# Patient Record
Sex: Female | Born: 1990 | Hispanic: Yes | Marital: Single | Smoking: Former smoker
Health system: Southern US, Community
[De-identification: ages and names within clinical notes are randomized; demographics above are authoritative.]

## PROBLEM LIST (undated history)

## (undated) DIAGNOSIS — O98819 Other maternal infectious and parasitic diseases complicating pregnancy, unspecified trimester: Secondary | ICD-10-CM

## (undated) DIAGNOSIS — A749 Chlamydial infection, unspecified: Secondary | ICD-10-CM

## (undated) HISTORY — PX: NO PAST SURGERIES: SHX2092

## (undated) HISTORY — DX: Chlamydial infection, unspecified: A74.9

## (undated) HISTORY — DX: Other maternal infectious and parasitic diseases complicating pregnancy, unspecified trimester: O98.819

---
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
## 2017-04-16 NOTE — L&D Delivery Note (Signed)
Delivery Summary for Cassie Garza  Labor Events:   Preterm labor:   Rupture date: 01/07/2018  Rupture time: 10:17 AM  Rupture type: Spontaneous Intact  Fluid Color:   Induction:   Augmentation:   Complications:   Cervical ripening:          Delivery:   Episiotomy:   Lacerations:   Repair suture:   Repair # of packets:   Blood loss (ml): 470   Information for the patient's newborn:  Raylene MiyamotoBrambila Garza, Boy Cierra [[id number]][[id number]]    Delivery 01/07/2018 3:42 PM by  Vaginal, Spontaneous Sex:  female Gestational Age: [id number] Delivery Clinician:   Living?:         APGARS  One minute Five minutes Ten minutes  Skin color:        Heart rate:        Grimace:        Muscle tone:        Breathing:        Totals: 8  9      Presentation/position:      Resuscitation:   Cord information:    Disposition of cord blood:     Blood gases sent?  Complications:   Placenta: Delivered:       appearance Newborn Measurements: Weight: 7 lb 4.8 oz (3310 g)  Height: 20.28"  Head circumference:    Chest circumference:    Other providers:    Additional  information: Forceps:   Vacuum:   Breech:   Observed anomalies        Delivery Note At 3:42 PM a viable and healthy female was delivered via Vaginal, Spontaneous (Presentation: Vertex; LOA position).  APGAR: 8, 9; weight  3310 grams.   Placenta status: spontaneously removed, intact.  Cord: 3-vessel, with the following complications: none.  Cord pH: not obtained. Delayed cord clamping observed for 2 minutes.   Anesthesia:  IV analgesia Episiotomy: None Lacerations: None Suture Repair: None Est. Blood Loss (mL): 470  Mom to postpartum.  Baby to Couplet care / Skin to Skin.  Hildred Lasernika Aeralyn Barna 01/07/2018, 5:21 PM

## 2017-07-05 ENCOUNTER — Ambulatory Visit (INDEPENDENT_AMBULATORY_CARE_PROVIDER_SITE_OTHER): Payer: BLUE CROSS/BLUE SHIELD | Admitting: Advanced Practice Midwife

## 2017-07-05 ENCOUNTER — Encounter: Payer: Self-pay | Admitting: Advanced Practice Midwife

## 2017-07-05 VITALS — BP 112/80 | Ht 62.0 in | Wt 169.0 lb

## 2017-07-05 DIAGNOSIS — Z131 Encounter for screening for diabetes mellitus: Secondary | ICD-10-CM

## 2017-07-05 DIAGNOSIS — Z124 Encounter for screening for malignant neoplasm of cervix: Secondary | ICD-10-CM

## 2017-07-05 DIAGNOSIS — Z113 Encounter for screening for infections with a predominantly sexual mode of transmission: Secondary | ICD-10-CM

## 2017-07-05 DIAGNOSIS — O99210 Obesity complicating pregnancy, unspecified trimester: Secondary | ICD-10-CM

## 2017-07-05 DIAGNOSIS — O0990 Supervision of high risk pregnancy, unspecified, unspecified trimester: Secondary | ICD-10-CM

## 2017-07-05 DIAGNOSIS — Z6830 Body mass index (BMI) 30.0-30.9, adult: Secondary | ICD-10-CM

## 2017-07-05 NOTE — Patient Instructions (Signed)
Prenatal Care WHAT IS PRENATAL CARE? Prenatal care is the process of caring for a pregnant woman before she gives birth. Prenatal care makes sure that she and her baby remain as healthy as possible throughout pregnancy. Prenatal care may be provided by a midwife, family practice health care provider, or a childbirth and pregnancy specialist (obstetrician). Prenatal care may include physical examinations, testing, treatments, and education on nutrition, lifestyle, and social support services. WHY IS PRENATAL CARE SO IMPORTANT? Early and consistent prenatal care increases the chance that you and your baby will remain healthy throughout your pregnancy. This type of care also decreases a baby's risk of being born too early (prematurely), or being born smaller than expected (small for gestational age). Any underlying medical conditions you may have that could pose a risk during your pregnancy are discussed during prenatal care visits. You will also be monitored regularly for any new conditions that may arise during your pregnancy so they can be treated quickly and effectively. WHAT HAPPENS DURING PRENATAL CARE VISITS? Prenatal care visits may include the following: Discussion Tell your health care provider about any new signs or symptoms you have experienced since your last visit. These might include:  Nausea or vomiting.  Increased or decreased level of energy.  Difficulty sleeping.  Back or leg pain.  Weight changes.  Frequent urination.  Shortness of breath with physical activity.  Changes in your skin, such as the development of a rash or itchiness.  Vaginal discharge or bleeding.  Feelings of excitement or nervousness.  Changes in your baby's movements.  You may want to write down any questions or topics you want to discuss with your health care provider and bring them with you to your appointment. Examination During your first prenatal care visit, you will likely have a complete  physical exam. Your health care provider will often examine your vagina, cervix, and the position of your uterus, as well as check your heart, lungs, and other body systems. As your pregnancy progresses, your health care provider will measure the size of your uterus and your baby's position inside your uterus. He or she may also examine you for early signs of labor. Your prenatal visits may also include checking your blood pressure and, after about 10-12 weeks of pregnancy, listening to your baby's heartbeat. Testing Regular testing often includes:  Urinalysis. This checks your urine for glucose, protein, or signs of infection.  Blood count. This checks the levels of white and red blood cells in your body.  Tests for sexually transmitted infections (STIs). Testing for STIs at the beginning of pregnancy is routinely done and is required in many states.  Antibody testing. You will be checked to see if you are immune to certain illnesses, such as rubella, that can affect a developing fetus.  Glucose screen. Around 24-28 weeks of pregnancy, your blood glucose level will be checked for signs of gestational diabetes. Follow-up tests may be recommended.  Group B strep. This is a bacteria that is commonly found inside a woman's vagina. This test will inform your health care provider if you need an antibiotic to reduce the amount of this bacteria in your body prior to labor and childbirth.  Ultrasound. Many pregnant women undergo an ultrasound screening around 18-20 weeks of pregnancy to evaluate the health of the fetus and check for any developmental abnormalities.  HIV (human immunodeficiency virus) testing. Early in your pregnancy, you will be screened for HIV. If you are at high risk for HIV, this test may   be repeated during your third trimester of pregnancy.  You may be offered other testing based on your age, personal or family medical history, or other factors. HOW OFTEN SHOULD I PLAN TO SEE MY  HEALTH CARE PROVIDER FOR PRENATAL CARE? Your prenatal care check-up schedule depends on any medical conditions you have before, or develop during, your pregnancy. If you do not have any underlying medical conditions, you will likely be seen for checkups:  Monthly, during the first 6 months of pregnancy.  Twice a month during months 7 and 8 of pregnancy.  Weekly starting in the 9th month of pregnancy and until delivery.  If you develop signs of early labor or other concerning signs or symptoms, you may need to see your health care provider more often. Ask your health care provider what prenatal care schedule is best for you. WHAT CAN I DO TO KEEP MYSELF AND MY BABY AS HEALTHY AS POSSIBLE DURING MY PREGNANCY?  Take a prenatal vitamin containing 400 micrograms (0.4 mg) of folic acid every day. Your health care provider may also ask you to take additional vitamins such as iodine, vitamin D, iron, copper, and zinc.  Take 1500-2000 mg of calcium daily starting at your 20th week of pregnancy until you deliver your baby.  Make sure you are up to date on your vaccinations. Unless directed otherwise by your health care provider: ? You should receive a tetanus, diphtheria, and pertussis (Tdap) vaccination between the 27th and 36th week of your pregnancy, regardless of when your last Tdap immunization occurred. This helps protect your baby from whooping cough (pertussis) after he or she is born. ? You should receive an annual inactivated influenza vaccine (IIV) to help protect you and your baby from influenza. This can be done at any point during your pregnancy.  Eat a well-rounded diet that includes: ? Fresh fruits and vegetables. ? Lean proteins. ? Calcium-rich foods such as milk, yogurt, hard cheeses, and dark, leafy greens. ? Whole grain breads.  Do noteat seafood high in mercury, including: ? Swordfish. ? Tilefish. ? Shark. ? King mackerel. ? More than 6 oz tuna per week.  Do not  eat: ? Raw or undercooked meats or eggs. ? Unpasteurized foods, such as soft cheeses (brie, blue, or feta), juices, and milks. ? Lunch meats. ? Hot dogs that have not been heated until they are steaming.  Drink enough water to keep your urine clear or pale yellow. For many women, this may be 10 or more 8 oz glasses of water each day. Keeping yourself hydrated helps deliver nutrients to your baby and may prevent the start of pre-term uterine contractions.  Do not use any tobacco products including cigarettes, chewing tobacco, or electronic cigarettes. If you need help quitting, ask your health care provider.  Do not drink beverages containing alcohol. No safe level of alcohol consumption during pregnancy has been determined.  Do not use any illegal drugs. These can harm your developing baby or cause a miscarriage.  Ask your health care provider or pharmacist before taking any prescription or over-the-counter medicines, herbs, or supplements.  Limit your caffeine intake to no more than 200 mg per day.  Exercise. Unless told otherwise by your health care provider, try to get 30 minutes of moderate exercise most days of the week. Do not  do high-impact activities, contact sports, or activities with a high risk of falling, such as horseback riding or downhill skiing.  Get plenty of rest.  Avoid anything that raises your   body temperature, such as hot tubs and saunas.  If you own a cat, do not empty its litter box. Bacteria contained in cat feces can cause an infection called toxoplasmosis. This can result in serious harm to the fetus.  Stay away from chemicals such as insecticides, lead, mercury, and cleaning or paint products that contain solvents.  Do not have any X-rays taken unless medically necessary.  Take a childbirth and breastfeeding preparation class. Ask your health care provider if you need a referral or recommendation.  This information is not intended to replace advice given  to you by your health care provider. Make sure you discuss any questions you have with your health care provider. Document Released: 04/05/2003 Document Revised: 09/05/2015 Document Reviewed: 06/17/2013 Elsevier Interactive Patient Education  2017 [employer] Exercise During Pregnancy For people of all ages, exercise is an important part of being healthy. Exercise improves heart and lung function and helps to maintain strength, flexibility, and a healthy body weight. Exercise also boosts energy levels and elevates mood. For most women, maintaining an exercise routine throughout pregnancy is recommended. It is only on rare occasions and with certain medical conditions or pregnancy complications that women may be asked to limit or avoid exercise during pregnancy. What are some other benefits to exercising during pregnancy? Along with maintaining strength and flexibility, exercising throughout pregnancy can help to:  Keep strength in muscles that are very important during labor and childbirth.  Decrease low back pain during pregnancy.  Decrease the risk of developing gestational diabetes mellitus (GDM).  Improve blood sugar (glucose) control for women who have GDM.  Decrease the risk of developing preeclampsia. This is a serious condition that causes high blood pressure along with other symptoms, such as swelling and headaches.  Decrease the risk of cesarean delivery.  Speed up the recovery after giving birth.  How often should I exercise? Unless your health care provider gives you different instructions, you should try to exercise on most days or all days of the week. In general, try to exercise with moderate intensity for about 150 minutes per week. This can be spread out across several days, such as exercising for 30 minutes per day on 5 days of each week. You can tell that you are exercising at a moderate intensity if you have a higher heart rate and faster breathing, but you are still  able to hold a conversation. What types of moderate-intensity exercise are recommended during pregnancy? There are many types of exercise that are safe for you to do during pregnancy. Unless your health care provider gives you different instructions, do a variety of exercises that safely increase your heart and breathing (cardiopulmonary) rates and help you to build and maintain muscle strength (strength training). You should always be able to talk in full sentences while exercising during pregnancy. Some examples of exercising that is safe to do during pregnancy include:  Brisk walking or hiking.  Swimming.  Water aerobics.  Riding a stationary bike.  Strength training.  Modified yoga or Pilates. Tell your instructor that you are pregnant. Avoid overstretching and avoid lying on your back for long periods of time.  Running or jogging. Only choose this type of exercise if: ? You ran or jogged regularly before your pregnancy. ? You can run or jog and still talk in complete sentences.  What types of exercise should I not do during pregnancy? Depending on your level of fitness and whether you exercised regularly before your pregnancy, you may be   advised to limit vigorous-intensity exercise during your pregnancy. You can tell that you are exercising at a vigorous intensity if you are breathing much harder and faster and cannot hold a conversation while exercising. Some examples of exercising that you should avoid during pregnancy include:  Contact sports.  Activities that place you at risk for falling on or being hit in the belly, such as downhill skiing, water skiing, surfing, rock climbing, cycling, gymnastics, and horseback riding.  Scuba diving.  Sky diving.  Yoga or Pilates in a room that is heated to extreme temperatures ("hot yoga" or "hot Pilates").  Jogging or running, unless you ran or jogged regularly before your pregnancy. While jogging or running, you should always be able  to talk in full sentences. Do not run or jog so vigorously that you are unable to have a conversation.  If you are not used to exercising at elevation (more than 6,000 feet above sea level), do not do so during your pregnancy.  When should I avoid exercising during pregnancy? Certain medical conditions can make it unsafe to exercise during pregnancy, or they may increase your risk of miscarriage or early labor and birth. Some of these conditions include:  Some types of heart disease.  Some types of lung disease.  Placenta previa. This is when the placenta partially or completely covers the opening of the uterus (cervix).  Frequent bleeding from the vagina during your pregnancy.  Incompetent cervix. This is when your cervix does not remain as tightly closed during pregnancy as it should.  Premature labor.  Ruptured membranes. This is when the protective sac (amniotic sac) opens up and amniotic fluid leaks from your vagina.  Severely low blood count (anemia).  Preeclampsia or pregnancy-caused high blood pressure.  Carrying more than one baby (multiple gestation) and having an additional risk of early labor.  Poorly controlled diabetes.  Being severely underweight or severely overweight.  Intrauterine growth restriction. This is when your baby's growth and development during pregnancy are slower than expected.  Other medical conditions. Ask your health care provider if any apply to you.  What else should I know about exercising during pregnancy? You should take these precautions while exercising during pregnancy:  Avoid overheating. ? Wear loose-fitting, breathable clothes. ? Do not exercise in very high temperatures.  Avoid dehydration. Drink enough water before, during, and after exercise to keep your urine clear or pale yellow.  Avoid overstretching. Because of hormone changes during pregnancy, it is easy to overstretch muscles, tendons, and ligaments during  pregnancy.  Start slowly and ask your health care provider to recommend types of exercise that are safe for you, if exercising regularly is new for you.  Pregnancy is not a time for exercising to lose weight. When should I seek medical care? You should stop exercising and call your health care provider if you have any unusual symptoms, such as:  Mild uterine contractions or abdominal cramping.  Dizziness that does not improve with rest.  When should I seek immediate medical care? You should stop exercising and call your local emergency services (911 in the [place] if you have any unusual symptoms, such as:  Sudden, severe pain in your low back or your belly.  Uterine contractions or abdominal cramping that do not improve with rest.  Chest pain.  Bleeding or fluid leaking from your vagina.  Shortness of breath.  This information is not intended to replace advice given to you by your health care provider. Make sure you discuss any   questions you have with your health care provider. Document Released: 04/02/2005 Document Revised: 08/31/2015 Document Reviewed: 06/10/2014 Elsevier Interactive Patient Education  2018 [employer] Eating Plan for Pregnant Women While you are pregnant, your body will require additional nutrition to help support your growing baby. It is recommended that you consume:  150 additional calories each day during your first trimester.  300 additional calories each day during your second trimester.  300 additional calories each day during your third trimester.  Eating a healthy, well-balanced diet is very important for your health and for your baby's health. You also have a higher need for some vitamins and minerals, such as folic acid, calcium, iron, and vitamin D. What do I need to know about eating during pregnancy?  Do not try to lose weight or go on a diet during pregnancy.  Choose healthy, nutritious foods. Choose  of a sandwich with a glass of milk  instead of a candy bar or a high-calorie sugar-sweetened beverage.  Limit your overall intake of foods that have "empty calories." These are foods that have little nutritional value, such as sweets, desserts, candies, sugar-sweetened beverages, and fried foods.  Eat a variety of foods, especially fruits and vegetables.  Take a prenatal vitamin to help meet the additional needs during pregnancy, specifically for folic acid, iron, calcium, and vitamin D.  Remember to stay active. Ask your health care provider for exercise recommendations that are specific to you.  Practice good food safety and cleanliness, such as washing your hands before you eat and after you prepare raw meat. This helps to prevent foodborne illnesses, such as listeriosis, that can be very dangerous for your baby. Ask your health care provider for more information about listeriosis. What does 150 extra calories look like? Healthy options for an additional 150 calories each day could be any of the following:  Plain low-fat yogurt (6-8 oz) with  cup of berries.  1 apple with 2 teaspoons of peanut butter.  Cut-up vegetables with  cup of hummus.  Low-fat chocolate milk (8 oz or 1 cup).  1 string cheese with 1 medium orange.   of a peanut butter and jelly sandwich on whole-wheat bread (1 tsp of peanut butter).  For 300 calories, you could eat two of those healthy options each day. What is a healthy amount of weight to gain? The recommended amount of weight for you to gain is based on your pre-pregnancy BMI. If your pre-pregnancy BMI was:  Less than 18 (underweight), you should gain 28-40 lb.  18-24.9 (normal), you should gain 25-35 lb.  25-29.9 (overweight), you should gain 15-25 lb.  Greater than 30 (obese), you should gain 11-20 lb.  What if I am having twins or multiples? Generally, pregnant women who will be having twins or multiples may need to increase their daily calories by 300-600 calories each day. The  recommended range for total weight gain is 25-54 lb, depending on your pre-pregnancy BMI. Talk with your health care provider for specific guidance about additional nutritional needs, weight gain, and exercise during your pregnancy. What foods can I eat? Grains Any grains. Try to choose whole grains, such as whole-wheat bread, oatmeal, or brown rice. Vegetables Any vegetables. Try to eat a variety of colors and types of vegetables to get a full range of vitamins and minerals. Remember to wash your vegetables well before eating. Fruits Any fruits. Try to eat a variety of colors and types of fruit to get a full range of vitamins and   minerals. Remember to wash your fruits well before eating. Meats and Other Protein Sources Lean meats, including chicken, turkey, fish, and lean cuts of beef, veal, or pork. Make sure that all meats are cooked to "well done." Tofu. Tempeh. Beans. Eggs. Peanut butter and other nut butters. Seafood, such as shrimp, crab, and lobster. If you choose fish, select types that are higher in omega-3 fatty acids, including salmon, herring, mussels, trout, sardines, and pollock. Make sure that all meats are cooked to food-safe temperatures. Dairy Pasteurized milk and milk alternatives. Pasteurized yogurt and pasteurized cheese. Cottage cheese. Sour cream. Beverages Water. Juices that contain 100% fruit juice or vegetable juice. Caffeine-free teas and decaffeinated coffee. Drinks that contain caffeine are okay to drink, but it is better to avoid caffeine. Keep your total caffeine intake to less than 200 mg each day (12 oz of coffee, tea, or soda) or as directed by your health care provider. Condiments Any pasteurized condiments. Sweets and Desserts Any sweets and desserts. Fats and Oils Any fats and oils. The items listed above may not be a complete list of recommended foods or beverages. Contact your dietitian for more options. What foods are not  recommended? Vegetables Unpasteurized (raw) vegetable juices. Fruits Unpasteurized (raw) fruit juices. Meats and Other Protein Sources Cured meats that have nitrates, such as bacon, salami, and hotdogs. Luncheon meats, bologna, or other deli meats (unless they are reheated until they are steaming hot). Refrigerated pate, meat spreads from a meat counter, smoked seafood that is found in the refrigerated section of a store. Raw fish, such as sushi or sashimi. High mercury content fish, such as tilefish, shark, swordfish, and king mackerel. Raw meats, such as tuna or beef tartare. Undercooked meats and poultry. Make sure that all meats are cooked to food-safe temperatures. Dairy Unpasteurized (raw) milk and any foods that have raw milk in them. Soft cheeses, such as feta, queso blanco, queso fresco, Brie, Camembert cheeses, blue-veined cheeses, and Panela cheese (unless it is made with pasteurized milk, which must be stated on the label). Beverages Alcohol. Sugar-sweetened beverages, such as sodas, teas, or energy drinks. Condiments Homemade fermented foods and drinks, such as pickles, sauerkraut, or kombucha drinks. (Store-bought pasteurized versions of these are okay.) Other Salads that are made in the store, such as ham salad, chicken salad, egg salad, tuna salad, and seafood salad. The items listed above may not be a complete list of foods and beverages to avoid. Contact your dietitian for more information. This information is not intended to replace advice given to you by your health care provider. Make sure you discuss any questions you have with your health care provider. Document Released: 01/15/2014 Document Revised: 09/08/2015 Document Reviewed: 09/15/2013 Elsevier Interactive Patient Education  2018 [employer]  

## 2017-07-05 NOTE — Progress Notes (Signed)
C/o little 'pinchy cramps' on both sides of abdomen.rj

## 2017-07-08 DIAGNOSIS — O0990 Supervision of high risk pregnancy, unspecified, unspecified trimester: Secondary | ICD-10-CM | POA: Insufficient documentation

## 2017-07-08 NOTE — Progress Notes (Signed)
New Obstetric Patient H&P    Chief Complaint: "Desires prenatal care"   History of Present Illness: Patient is a 27 y.o. G1P0 Hispanic or Latino female, presents with amenorrhea and positive home pregnancy test. Patient's last menstrual period was 04/08/2017. and based on her  LMP, her EDD is Estimated Date of Delivery: 01/13/18 and her EGA is 9468w4d. Cycles are 5. days, irregular, and occur approximately every : 30-45 days. She has never had a PAP smear.   She had a urine pregnancy test which was positive 3 week(s)  ago. Her last menstrual period was normal and lasted for  5 day(s). Since her LMP she claims she has experienced breast tenderness, fatigue, nausea, vomiting. She denies vaginal bleeding. Her past medical history is noncontributory. This is her first pregnancy.  Since her LMP, she admits to the use of tobacco products  no She claims she has gained   2 pounds since the start of her pregnancy.  There are cats in the home in the home  no  She admits close contact with children on a regular basis  no  She has had chicken pox in the past yes She has had Tuberculosis exposures, symptoms, or previously tested positive for TB   no Current or past history of domestic violence. no  Genetic Screening/Teratology Counseling: (Includes patient, baby's father, or anyone in either family with:)   1. [facility]= [facility]  no 2. Thalassemia ([place], [place], Mediterranean, or Asian background): MCV<80  no 3. Neural tube defect (meningomyelocele, spina bifida, anencephaly)  no 4. Congenital heart defect  no  5. Down syndrome  no 6. Tay-Sachs (Jewish, [place])  no 7. Canavan's Disease  no 8. Sickle cell disease or trait (African)  no  9. Hemophilia or other blood disorders  no  10. Muscular dystrophy  no  11. Cystic fibrosis  no  12. Huntington's Chorea  no  13. Mental retardation/autism  no 14. Other inherited genetic or chromosomal disorder  no 15. Maternal metabolic  disorder (DM, PKU, etc)  no 16. Patient or FOB with a child with a birth defect not listed above no  16a. Patient or FOB with a birth defect themselves no 17. Recurrent pregnancy loss, or stillbirth  no  18. Any medications since LMP other than prenatal vitamins (include vitamins, supplements, OTC meds, drugs, alcohol)  no 19. Any other genetic/environmental exposure to discuss  no  Infection History:   1. Lives with someone with TB or TB exposed  no  2. Patient or partner has history of genital herpes  no 3. Rash or viral illness since LMP  no 4. History of STI (GC, CT, HPV, syphilis, HIV)  no 5. History of recent travel :  no  Other pertinent information:  no     Review of Systems:10 point review of systems negative unless otherwise noted in HPI  Past Medical History:  History reviewed. No pertinent past medical history.  Past Surgical History:  Past Surgical History:  Procedure Laterality Date  . NO PAST SURGERIES      Gynecologic History: Patient's last menstrual period was 04/08/2017.  Obstetric History: G1P0  Family History:  Family History  Problem Relation Age of Onset  . Cancer Brother 7       LEUKEMIA  . Diabetes Maternal Grandmother   . Hypertension Maternal Grandmother     Social History:  Social History   Socioeconomic History  . Marital status: Single    Spouse name: Not  on file  . Number of children: 0  . Years of education: 82  . Highest education level: Not on file  Occupational History  . Occupation: [employer]    Comment: [employer]  Social Needs  . Financial resource strain: Not on file  . Food insecurity:    Worry: Not on file    Inability: Not on file  . Transportation needs:    Medical: Not on file    Non-medical: Not on file  Tobacco Use  . Smoking status: Never Smoker  . Smokeless tobacco: Never Used  Substance and Sexual Activity  . Alcohol use: Not Currently  . Drug use: Never  . Sexual activity: Yes    Birth  control/protection: None  Lifestyle  . Physical activity:    Days per week: Not on file    Minutes per session: Not on file  . Stress: Not on file  Relationships  . Social connections:    Talks on phone: Not on file    Gets together: Not on file    Attends religious service: Not on file    Active member of club or organization: Not on file    Attends meetings of clubs or organizations: Not on file    Relationship status: Not on file  . Intimate partner violence:    Fear of current or ex partner: Not on file    Emotionally abused: Not on file    Physically abused: Not on file    Forced sexual activity: Not on file  Other Topics Concern  . Not on file  Social History Narrative  . Not on file    Allergies:  No Known Allergies  Medications: Prior to Admission medications   Not on File    Physical Exam Vitals: Blood pressure 112/80, height 5\' 2"  (1.575 m), weight 169 lb (76.7 kg), last menstrual period 04/08/2017.  General: NAD HEENT: normocephalic, anicteric Thyroid: no enlargement, no palpable nodules Pulmonary: No increased work of breathing, CTAB Cardiovascular: RRR, distal pulses 2+ Abdomen: NABS, soft, non-tender, non-distended.  Umbilicus without lesions.  No hepatomegaly, splenomegaly or masses palpable. No evidence of hernia  Genitourinary:  External: Normal external female genitalia.  Normal urethral meatus, normal  Bartholin's and Skene's glands.    Vagina: Normal vaginal mucosa, no evidence of prolapse.    Cervix: Grossly normal in appearance, no bleeding, no CMT  Uterus: Enlarged, mobile, normal contour.    Adnexa: ovaries non-enlarged, no adnexal masses  Rectal: deferred Extremities: no edema, erythema, or tenderness Neurologic: Grossly intact Psychiatric: mood appropriate, affect full   Assessment: 27 y.o. G1P0 at [redacted]w[redacted]d presenting to initiate prenatal care  Plan: 1) Avoid alcoholic beverages. 2) Patient encouraged not to smoke.  3) Discontinue the  use of all non-medicinal drugs and chemicals.  4) Take prenatal vitamins daily.  5) Nutrition, food safety (fish, cheese advisories, and high nitrite foods) and exercise discussed. 6) Hospital and practice style discussed with cross coverage system.  7) Genetic Screening, such as with 1st Trimester Screening, cell free fetal DNA, AFP testing, and Ultrasound, as well as with amniocentesis and CVS as appropriate, is discussed with patient. At the conclusion of today's visit patient declined genetic testing 8) Patient is asked about travel to areas at risk for the [place] virus, and counseled to avoid travel and exposure to mosquitoes or sexual partners who may have themselves been exposed to the virus. Testing is discussed, and will be ordered as appropriate.  9) Dating scan in 1 week  Tresea Mall, CNM [facility], [facility] 07/08/2017, 1:25 PM

## 2017-07-10 LAB — URINE CULTURE

## 2017-07-10 LAB — IGP,CTNGTV,RFX APTIMA HPV ASCU
CHLAMYDIA, NUC. ACID AMP: POSITIVE — AB
GONOCOCCUS, NUC. ACID AMP: NEGATIVE
PAP Smear Comment: 0
TRICH VAG BY NAA: NEGATIVE

## 2017-07-15 ENCOUNTER — Ambulatory Visit (INDEPENDENT_AMBULATORY_CARE_PROVIDER_SITE_OTHER): Payer: BLUE CROSS/BLUE SHIELD

## 2017-07-15 ENCOUNTER — Other Ambulatory Visit: Payer: BLUE CROSS/BLUE SHIELD

## 2017-07-15 ENCOUNTER — Other Ambulatory Visit: Payer: Self-pay | Admitting: Advanced Practice Midwife

## 2017-07-15 ENCOUNTER — Ambulatory Visit (INDEPENDENT_AMBULATORY_CARE_PROVIDER_SITE_OTHER): Payer: BLUE CROSS/BLUE SHIELD | Admitting: Obstetrics and Gynecology

## 2017-07-15 ENCOUNTER — Encounter: Payer: Self-pay | Admitting: Obstetrics and Gynecology

## 2017-07-15 VITALS — BP 118/74 | Wt 170.0 lb

## 2017-07-15 DIAGNOSIS — Z6830 Body mass index (BMI) 30.0-30.9, adult: Secondary | ICD-10-CM

## 2017-07-15 DIAGNOSIS — O0990 Supervision of high risk pregnancy, unspecified, unspecified trimester: Secondary | ICD-10-CM

## 2017-07-15 DIAGNOSIS — O99210 Obesity complicating pregnancy, unspecified trimester: Secondary | ICD-10-CM

## 2017-07-15 DIAGNOSIS — Z113 Encounter for screening for infections with a predominantly sexual mode of transmission: Secondary | ICD-10-CM

## 2017-07-15 DIAGNOSIS — A749 Chlamydial infection, unspecified: Secondary | ICD-10-CM

## 2017-07-15 DIAGNOSIS — Z3A14 14 weeks gestation of pregnancy: Secondary | ICD-10-CM

## 2017-07-15 DIAGNOSIS — Z3689 Encounter for other specified antenatal screening: Secondary | ICD-10-CM

## 2017-07-15 DIAGNOSIS — O98812 Other maternal infectious and parasitic diseases complicating pregnancy, second trimester: Secondary | ICD-10-CM

## 2017-07-15 DIAGNOSIS — O2340 Unspecified infection of urinary tract in pregnancy, unspecified trimester: Secondary | ICD-10-CM | POA: Insufficient documentation

## 2017-07-15 DIAGNOSIS — O2342 Unspecified infection of urinary tract in pregnancy, second trimester: Secondary | ICD-10-CM

## 2017-07-15 DIAGNOSIS — O98819 Other maternal infectious and parasitic diseases complicating pregnancy, unspecified trimester: Secondary | ICD-10-CM

## 2017-07-15 DIAGNOSIS — Z131 Encounter for screening for diabetes mellitus: Secondary | ICD-10-CM

## 2017-07-15 HISTORY — DX: Other maternal infectious and parasitic diseases complicating pregnancy, unspecified trimester: A74.9

## 2017-07-15 LAB — OB RESULTS CONSOLE VARICELLA ZOSTER ANTIBODY, IGG: Varicella: IMMUNE

## 2017-07-15 MED ORDER — CEPHALEXIN 500 MG PO CAPS: 500.0000 mg | ORAL_CAPSULE | Freq: Four times a day (QID) | ORAL | 2 refills | Status: DC

## 2017-07-15 MED ORDER — AZITHROMYCIN 500 MG PO TABS: 1000.0000 mg | ORAL_TABLET | Freq: Once | ORAL | 0 refills | Status: AC

## 2017-07-15 NOTE — Progress Notes (Signed)
  Routine Prenatal Care Visit  Subjective  Cassie MoloneyFabiola Betsey HolidayBrambila Morales is a 27 y.o. G1P0 at 7319w0d being seen today for ongoing prenatal care.  She is currently monitored for the following issues for this high-risk pregnancy and has Supervision of high risk pregnancy, antepartum; Obesity in pregnancy; BMI 30.0-30.9,adult; Chlamydia infection affecting pregnancy; and UTI (urinary tract infection) during pregnancy on their problem list.  ----------------------------------------------------------------------------------- Patient reports no complaints.    . Vag. Bleeding: None.   . Denies leaking of fluid.  ----------------------------------------------------------------------------------- The following portions of the patient's history were reviewed and updated as appropriate: allergies, current medications, past family history, past medical history, past social history, past surgical history and problem list. Problem list updated.   Objective  Blood pressure 118/74, weight 170 lb (77.1 kg), last menstrual period 04/08/2017. Pregravid weight 167 lb (75.8 kg) Total Weight Gain 3 lb (1.361 kg) Urinalysis: Urine Protein: Negative Urine Glucose: Negative  Fetal Status: Fetal Heart Rate (bpm): Present         General:  Alert, oriented and cooperative. Patient is in no acute distress.  Skin: Skin is warm and dry. No rash noted.   Cardiovascular: Normal heart rate noted  Respiratory: Normal respiratory effort, no problems with respiration noted  Abdomen: Soft, gravid, appropriate for gestational age. Pain/Pressure: Absent     Pelvic:  Cervical exam deferred        Extremities: Normal range of motion.     Mental Status: Normal mood and affect. Normal behavior. Normal judgment and thought content.   Assessment   27 y.o. G1P0 at 6919w0d by  01/13/2018, by Last Menstrual Period presenting for routine prenatal visit  Plan   FIRST Problems (from 07/05/17 to present)    Problem Noted Resolved   Obesity  in pregnancy 07/15/2017 by Conard NovakJackson, Nolberto Cheuvront D, MD No   Overview Signed 07/15/2017  3:39 PM by Conard NovakJackson, Alhaji Mcneal D, MD    [ ]  early 1h gtt      BMI 30.0-30.9,adult 07/15/2017 by Conard NovakJackson, Caressa Scearce D, MD No   Chlamydia infection affecting pregnancy 07/15/2017 by Conard NovakJackson, Herley Bernardini D, MD No   Overview Signed 07/15/2017  3:52 PM by Conard NovakJackson, Loma Dubuque D, MD    - Treated 4/1 [ ]  needs TOC early May      UTI (urinary tract infection) during pregnancy 07/15/2017 by Conard NovakJackson, Emely Fahy D, MD No       Preterm labor symptoms and general obstetric precautions including but not limited to vaginal bleeding, contractions, leaking of fluid and fetal movement were reviewed in detail with the patient. Please refer to After Visit Summary for other counseling recommendations.  - Discussed chlamydia infection. Will provide rx. Discussed that partner needs to be treated. She is confident her partner will be treated. Discussed abstaining until treatment complete.  TOC one month.  - Treat UTI with keflex (intermediate to macrobid) -anatomy u/s next visit -NOB labs and 1h gtt today.  Return in about 1 month (around 08/12/2017) for u/s for anatomy and routine prenatal.  Thomasene MohairStephen Journey Ratterman, MD, Merlinda FrederickFACOG [facility], [facility] 07/15/2017 3:54 PM

## 2017-07-15 NOTE — Progress Notes (Incomplete)
  Routine Prenatal Care Visit  Subjective  Cassie Garza is a 27 y.o. G1P0 at 5579w0d being seen today for ongoing prenatal care.  She is currently monitored for the following issues for this {Blank single:19197::"high-risk","low-risk"} pregnancy and has Supervision of high risk pregnancy, antepartum; Obesity in pregnancy; BMI 30.0-30.9,adult; Chlamydia infection affecting pregnancy; and UTI (urinary tract infection) during pregnancy on their problem list.  ----------------------------------------------------------------------------------- Patient reports {sx:14538}.    . Vag. Bleeding: None.   . Denies leaking of fluid.  ----------------------------------------------------------------------------------- The following portions of the patient's history were reviewed and updated as appropriate: allergies, current medications, past family history, past medical history, past social history, past surgical history and problem list. Problem list updated.   Objective  Blood pressure 118/74, weight 170 lb (77.1 kg), last menstrual period 04/08/2017. Pregravid weight 167 lb (75.8 kg) Total Weight Gain 3 lb (1.361 kg) Urinalysis: Urine Protein: Negative Urine Glucose: Negative  Fetal Status: Fetal Heart Rate (bpm): Present         General:  Alert, oriented and cooperative. Patient is in no acute distress.  Skin: Skin is warm and dry. No rash noted.   Cardiovascular: Normal heart rate noted  Respiratory: Normal respiratory effort, no problems with respiration noted  Abdomen: Soft, gravid, appropriate for gestational age. Pain/Pressure: Absent     Pelvic:  {Blank single:19197::"Cervical exam performed","Cervical exam deferred"}        Extremities: Normal range of motion.     Mental Status: Normal mood and affect. Normal behavior. Normal judgment and thought content.   Assessment   27 y.o. G1P0 at 2179w0d by  01/13/2018, by Last Menstrual Period presenting for {Blank  single:19197::"routine","work-in"} prenatal visit  Plan   FIRST Problems (from 07/05/17 to present)    Problem Noted Resolved   Obesity in pregnancy 07/15/2017 by Conard NovakJackson, Halen Mossbarger D, MD No   Overview Signed 07/15/2017  3:39 PM by Conard NovakJackson, Shaquila Sigman D, MD    [ ]  early 1h gtt      BMI 30.0-30.9,adult 07/15/2017 by Conard NovakJackson, Hydeia Mcatee D, MD No   Chlamydia infection affecting pregnancy 07/15/2017 by Conard NovakJackson, Reeya Bound D, MD No   Overview Signed 07/15/2017  3:52 PM by Conard NovakJackson, Spenser Harren D, MD    - Treated 4/1 [ ]  needs TOC early May      UTI (urinary tract infection) during pregnancy 07/15/2017 by Conard NovakJackson, Kynslei Art D, MD No       {Blank single:19197::"Term","Preterm"} labor symptoms and general obstetric precautions including but not limited to vaginal bleeding, contractions, leaking of fluid and fetal movement were reviewed in detail with the patient. Please refer to After Visit Summary for other counseling recommendations.   Return in about 1 month (around 08/12/2017) for u/s for anatomy and routine prenatal.  Thomasene MohairStephen Elvina Bosch, MD, Merlinda FrederickFACOG [facility], [facility] 07/15/2017 3:54 PM

## 2017-07-16 LAB — RPR+RH+ABO+RUB AB+AB SCR+CB...
ANTIBODY SCREEN: NEGATIVE
HEMATOCRIT: 34.1 % (ref 34.0–46.6)
HEMOGLOBIN: 11.7 g/dL (ref 11.1–15.9)
HIV Screen 4th Generation wRfx: NONREACTIVE
Hepatitis B Surface Ag: NEGATIVE
MCH: 29.8 pg (ref 26.6–33.0)
MCHC: 34.3 g/dL (ref 31.5–35.7)
MCV: 87 fL (ref 79–97)
Platelets: 223 10*3/uL (ref 150–379)
RBC: 3.93 x10E6/uL (ref 3.77–5.28)
RDW: 14.6 % (ref 12.3–15.4)
RH TYPE: POSITIVE
RPR Ser Ql: NONREACTIVE
RUBELLA: 5.77 {index} (ref >0.99)
Varicella zoster IgG: >4000 index (ref >165)
WBC: 6.4 10*3/uL (ref 3.4–10.8)

## 2017-07-16 LAB — HEMOGLOBINOPATHY EVALUATION
HEMOGLOBIN F QUANTITATION: 0 % (ref 0.0–2.0)
HGB A: 97.6 % (ref 96.4–98.8)
HGB C: 0 %
HGB S: 0 %
HGB VARIANT: 0 %
Hemoglobin A2 Quantitation: 2.4 % (ref 1.8–3.2)

## 2017-07-16 LAB — GLUCOSE, 1 HOUR GESTATIONAL: Gestational Diabetes Screen: 120 mg/dL (ref 65–139)

## 2017-07-25 ENCOUNTER — Telehealth: Payer: Self-pay

## 2017-07-25 NOTE — Telephone Encounter (Signed)
FMLA/DISABILITY form for [employer] filled out, signature obtained, and given to [place] for processing.

## 2017-07-27 ENCOUNTER — Emergency Department: Payer: BLUE CROSS/BLUE SHIELD

## 2017-07-27 ENCOUNTER — Encounter: Payer: Self-pay | Admitting: Medical Oncology

## 2017-07-27 ENCOUNTER — Emergency Department
Admission: EM | Admit: 2017-07-27 | Discharge: 2017-07-27 | Disposition: A | Discharge status: Home / Self Care | Payer: BLUE CROSS/BLUE SHIELD | Attending: Emergency Medicine | Admitting: Emergency Medicine

## 2017-07-27 DIAGNOSIS — O200 Threatened abortion: Secondary | ICD-10-CM | POA: Insufficient documentation

## 2017-07-27 DIAGNOSIS — O4690 Antepartum hemorrhage, unspecified, unspecified trimester: Secondary | ICD-10-CM

## 2017-07-27 DIAGNOSIS — O26852 Spotting complicating pregnancy, second trimester: Secondary | ICD-10-CM | POA: Insufficient documentation

## 2017-07-27 DIAGNOSIS — Z3A15 15 weeks gestation of pregnancy: Secondary | ICD-10-CM | POA: Insufficient documentation

## 2017-07-27 LAB — BASIC METABOLIC PANEL
ANION GAP: 5 (ref 5–15)
BUN: 7 mg/dL (ref 6–20)
CALCIUM: 9.3 mg/dL (ref 8.9–10.3)
CHLORIDE: 104 mmol/L (ref 101–111)
CO2: 23 mmol/L (ref 22–32)
Creatinine, Ser: 0.46 mg/dL (ref 0.44–1.00)
GFR calc non Af Amer: >60 mL/min (ref >60)
Glucose, Bld: 100 mg/dL — ABNORMAL HIGH (ref 65–99)
Potassium: 3.7 mmol/L (ref 3.5–5.1)
SODIUM: 132 mmol/L — AB (ref 135–145)

## 2017-07-27 LAB — HCG, QUANTITATIVE, PREGNANCY: hCG, Beta Chain, Quant, S: 49145 m[IU]/mL — ABNORMAL HIGH (ref <5)

## 2017-07-27 LAB — CBC WITH DIFFERENTIAL/PLATELET
BASOS ABS: 0 10*3/uL (ref 0–0.1)
BASOS PCT: 1 %
Eosinophils Absolute: 0.2 10*3/uL (ref 0–0.7)
Eosinophils Relative: 2 %
HEMATOCRIT: 35.6 % (ref 35.0–47.0)
HEMOGLOBIN: 12.6 g/dL (ref 12.0–16.0)
Lymphocytes Relative: 30 %
Lymphs Abs: 2.6 10*3/uL (ref 1.0–3.6)
MCH: 30.1 pg (ref 26.0–34.0)
MCHC: 35.3 g/dL (ref 32.0–36.0)
MCV: 85.5 fL (ref 80.0–100.0)
Monocytes Absolute: 0.7 10*3/uL (ref 0.2–0.9)
Monocytes Relative: 8 %
NEUTROS ABS: 5.1 10*3/uL (ref 1.4–6.5)
Neutrophils Relative %: 59 %
Platelets: 259 10*3/uL (ref 150–440)
RBC: 4.16 MIL/uL (ref 3.80–5.20)
RDW: 14.8 % — ABNORMAL HIGH (ref 11.5–14.5)
WBC: 8.6 10*3/uL (ref 3.6–11.0)

## 2017-07-27 NOTE — ED Triage Notes (Addendum)
Pt reports she is [redacted] weeks pregnant, yesterday she began having some vaginal spotting, bleeding increased last night. Pt denies pain. G1P0. EDD 01/13/18

## 2017-07-27 NOTE — Discharge Instructions (Signed)
Fortunately today your blood work and your ultrasound were reassuring.  Please follow-up with your OB gynecologist this coming Monday for reevaluation and return to the emergency department sooner for any concerns such as worsening bleeding, painful bleeding, or for any other issues whatsoever.  It was a pleasure to take care of you today, and thank you for coming to our emergency department.  If you have any questions or concerns before leaving please ask the nurse to grab me and I'm more than happy to go through your aftercare instructions again.  If you were prescribed any opioid pain medication today such as Norco, Vicodin, Percocet, morphine, hydrocodone, or oxycodone please make sure you do not drive when you are taking this medication as it can alter your ability to drive safely.  If you have any concerns once you are home that you are not improving or are in fact getting worse before you can make it to your follow-up appointment, please do not hesitate to call 911 and come back for further evaluation.  Merrily Brittle, MD  Results for orders placed or performed during the hospital encounter of 07/27/17  hCG, quantitative, pregnancy  Result Value Ref Range   hCG, Beta Chain, Quant, S 49,145 (H) <5 mIU/mL  CBC with Differential  Result Value Ref Range   WBC 8.6 3.6 - 11.0 K/uL   RBC 4.16 3.80 - 5.20 MIL/uL   Hemoglobin 12.6 12.0 - 16.0 g/dL   HCT 16.1 09.6 - 04.5 %   MCV 85.5 80.0 - 100.0 fL   MCH 30.1 26.0 - 34.0 pg   MCHC 35.3 32.0 - 36.0 g/dL   RDW 40.9 (H) 81.1 - 91.4 %   Platelets 259 150 - 440 K/uL   Neutrophils Relative % 59 %   Neutro Abs 5.1 1.4 - 6.5 K/uL   Lymphocytes Relative 30 %   Lymphs Abs 2.6 1.0 - 3.6 K/uL   Monocytes Relative 8 %   Monocytes Absolute 0.7 0.2 - 0.9 K/uL   Eosinophils Relative 2 %   Eosinophils Absolute 0.2 0 - 0.7 K/uL   Basophils Relative 1 %   Basophils Absolute 0.0 0 - 0.1 K/uL  Basic metabolic panel  Result Value Ref Range   Sodium 132  (L) 135 - 145 mmol/L   Potassium 3.7 3.5 - 5.1 mmol/L   Chloride 104 101 - 111 mmol/L   CO2 23 22 - 32 mmol/L   Glucose, Bld 100 (H) 65 - 99 mg/dL   BUN 7 6 - 20 mg/dL   Creatinine, Ser 7.82 0.44 - 1.00 mg/dL   Calcium 9.3 8.9 - 95.6 mg/dL   GFR calc non Af Amer >60 >60 mL/min   GFR calc Af Amer >60 >60 mL/min   Anion gap 5 5 - 15   US Ob Limited > 14 Wks  Result Date: 07/27/2017 CLINICAL DATA:  Fifteen weeks pregnant, vaginal spotting last night EXAM: LIMITED OBSTETRIC ULTRASOUND FINDINGS: Number of Fetuses: 1 Heart Rate:  140 bpm Movement: Yes Presentation: Cephalic Placental Location: Fundal posterior Previa: No Amniotic Fluid (Subjective):  Within normal limits. BPD: 3.2 cm 16 w  1 d Femur length: 1.8 cm 15 w 2 d MATERNAL FINDINGS: Cervix:  Appears closed.  Measures 3.6 cm in length. Uterus/Adnexae: No abnormality visualized. IMPRESSION: Single live intrauterine pregnancy with estimated gestational age of [redacted] weeks and 1 day by BPD measurement. This exam is performed on an emergent basis and does not comprehensively evaluate fetal size, dating, or anatomy; follow-up complete  OB US should be considered if further fetal assessment is warranted. Electronically Signed   By: Bary RichardStan  Maynard M.D.   On: 07/27/2017 11:41   [place] Ob Follow Up  Result Date: 07/15/2017 ULTRASOUND REPORT Location: [facility] Date of Service: 07/15/2017 Indications:dating >/= 14 weeks Findings: Mason JimSingleton intrauterine pregnancy is visualized with a CRL consistent with 4246w4d gestation and a BPD consistent with 2648w1d, which gives a gestational age of 1831w6d, giving an (U/S) EDD of 01/14/2018 The (U/S) EDD is consistent with the clinically established EDD based on LMP of 01/13/2018. FHR: 167 bpm CRL measurement: 73.9 mm BPD measurement: 24.253mm Yolk sac is not visualized and early anatomy is normal. Amnion: visualized and appears normal Right Ovary is not identified. Left Ovary is normal appearance. Corpus luteal cyst:  is not  visualized Survey of the adnexa demonstrates no adnexal masses. There is no free peritoneal fluid in the cul de sac. Impression: 1. 331w6d Viable Singleton Intrauterine pregnancy by U/S. 2. (U/S) EDD is consistent with Clinically established EDD of 01/13/2018. Recommendations: 1.Clinical correlation with the patient's History and Physical Exam. Willette AlmaKristen Priestley, RDMS, RVT There is a viable singleton gestation.  Detailed evaluation of the fetal anatomy is precluded by early gestational age.  It must be noted that a normal ultrasound particular at this early gestational age is unable to rule out fetal aneuploidy, risk of first trimester miscarriage, or anatomic birth defects. Thomasene MohairStephen Jackson, MD, Merlinda FrederickFACOG [facility], [facility] 07/15/2017 3:35 PM

## 2017-07-27 NOTE — ED Notes (Signed)
Patient transported to Ultrasound 

## 2017-07-27 NOTE — ED Provider Notes (Signed)
[facility] Emergency Department Provider Note  ____________________________________________   First MD Initiated Contact with Patient 07/27/17 1057     (approximate)  I have reviewed the triage vital signs and the nursing notes.   HISTORY  Chief Complaint Vaginal Bleeding   HPI Cassie Garza is a 27 y.o. female who self presents to the emergency department with 1 day of intermittent painless vaginal spotting.  She is G1 P0 roughly [redacted] weeks pregnant with a desired pregnancy.  She has gotten prenatal care.  She has been treated recently for chlamydia infection as well as urinary tract infection.  She is currently 5 days into her 7-day course of Keflex.  She feels like her spotting is related to taking Keflex.  She noted a small amount of spotting on her underwear yesterday that improved however after taking another dose of Keflex today there was again more spotting which concerned her.  Her symptoms have been intermittent.  Nothing particular seems to make them better.  No fevers or chills.  No vaginal discharge.  History reviewed. No pertinent past medical history.  Patient Active Problem List   Diagnosis Date Noted  . Obesity in pregnancy 07/15/2017  . BMI 30.0-30.9,adult 07/15/2017  . Chlamydia infection affecting pregnancy 07/15/2017  . UTI (urinary tract infection) during pregnancy 07/15/2017  . Supervision of high risk pregnancy, antepartum 07/08/2017    Past Surgical History:  Procedure Laterality Date  . NO PAST SURGERIES      Prior to Admission medications   Medication Sig Start Date End Date Taking? Authorizing Provider  cephALEXin (KEFLEX) 500 MG capsule Take 1 capsule (500 mg total) by mouth 4 (four) times daily. 07/15/17   Conard Novak, MD    Allergies Patient has no known allergies.  Family History  Problem Relation Age of Onset  . Cancer Brother 7       LEUKEMIA  . Diabetes Maternal Grandmother   . Hypertension  Maternal Grandmother     Social History Social History   Tobacco Use  . Smoking status: Never Smoker  . Smokeless tobacco: Never Used  Substance Use Topics  . Alcohol use: Not Currently  . Drug use: Never    Review of Systems Constitutional: No fever/chills Eyes: No visual changes. ENT: No sore throat. Cardiovascular: Denies chest pain. Respiratory: Denies shortness of breath. Gastrointestinal: No abdominal pain.  No nausea, no vomiting.  No diarrhea.  No constipation. Genitourinary: Negative for dysuria. Musculoskeletal: Negative for back pain. Skin: Negative for rash. Neurological: Negative for headaches, focal weakness or numbness.   ____________________________________________   PHYSICAL EXAM:  VITAL SIGNS: ED Triage Vitals  Enc Vitals Group     BP 07/27/17 1018 106/63     Pulse Rate 07/27/17 1018 78     Resp 07/27/17 1018 18     Temp 07/27/17 1018 98.3 F (36.8 C)     Temp Source 07/27/17 1018 Oral     SpO2 07/27/17 1018 100 %     Weight 07/27/17 1018 170 lb (77.1 kg)     Height 07/27/17 1018  (1.575 m)     Head Circumference --      Peak Flow --      Pain Score 07/27/17 1021 0     Pain Loc --      Pain Edu? --      Excl. in GC? --    Constitutional: Alert and oriented x4 pleasant cooperative speaks in full clear sentences no diaphoresis Eyes: PERRL  EOMI. Head: Atraumatic. Nose: No congestion/rhinnorhea. Mouth/Throat: No trismus Neck: No stridor.   Cardiovascular: Normal rate, regular rhythm. Grossly normal heart sounds.  Good peripheral circulation. Respiratory: Normal respiratory effort.  No retractions. Lungs CTAB and moving good air Gastrointestinal: Soft nontender Musculoskeletal: No lower extremity edema   Neurologic:  Normal speech and language. No gross focal neurologic deficits are appreciated. Skin:  Skin is warm, dry and intact. No rash noted. Psychiatric: Mood and affect are normal. Speech and behavior are  normal.    ____________________________________________   DIFFERENTIAL includes but not limited to  Ectopic pregnancy, ovarian torsion, threatened abortion, completed abortion, inevitable abortion ____________________________________________   LABS (all labs ordered are listed, but only abnormal results are displayed)  Labs Reviewed  HCG, QUANTITATIVE, PREGNANCY - Abnormal; Notable for the following components:      Result Value   hCG, Beta Chain, Quant, S 49,145 (*)    All other components within normal limits  CBC WITH DIFFERENTIAL/PLATELET - Abnormal; Notable for the following components:   RDW 14.8 (*)    All other components within normal limits  BASIC METABOLIC PANEL - Abnormal; Notable for the following components:   Sodium 132 (*)    Glucose, Bld 100 (*)    All other components within normal limits    Lab work reviewed by me shows that the patient is Rh+ __________________________________________  EKG   ____________________________________________  RADIOLOGY  OB ultrasound reviewed by me shows intrauterine pregnancy with no acute disease noted ____________________________________________   PROCEDURES  Procedure(s) performed: no  Procedures  Critical Care performed: no  Observation: no ____________________________________________   INITIAL IMPRESSION / ASSESSMENT AND PLAN / ED COURSE  Pertinent labs & imaging results that were available during my care of the patient were reviewed by me and considered in my medical decision making (see chart for details).  The patient arrives with mild vaginal spotting.  She is Rh+ and does not require RhoGam.  Ultrasound is reassuring.  I offered the patient a pelvic exam today to evaluate her office however she declined stating she would prefer to follow-up with [facility] gynecology which I think is reasonable.  No frank peritonitis.  Pelvic rest has been discussed.  Strict return precautions given.  The patient verbalizes  understanding and agreement with the plan.      ____________________________________________   FINAL CLINICAL IMPRESSION(S) / ED DIAGNOSES  Final diagnoses:  Vaginal bleeding in pregnancy  Threatened miscarriage      NEW MEDICATIONS STARTED DURING THIS VISIT:  Discharge Medication List as of 07/27/2017 11:57 AM       Note:  This document was prepared using Dragon voice recognition software and may include unintentional dictation errors.     Merrily Brittle, MD 07/28/17 1359

## 2017-07-29 ENCOUNTER — Ambulatory Visit (INDEPENDENT_AMBULATORY_CARE_PROVIDER_SITE_OTHER): Payer: BLUE CROSS/BLUE SHIELD

## 2017-07-29 ENCOUNTER — Ambulatory Visit: Payer: BLUE CROSS/BLUE SHIELD | Admitting: Certified Registered Nurse Anesthetist

## 2017-07-29 ENCOUNTER — Ambulatory Visit (INDEPENDENT_AMBULATORY_CARE_PROVIDER_SITE_OTHER): Payer: BLUE CROSS/BLUE SHIELD | Admitting: Obstetrics and Gynecology

## 2017-07-29 ENCOUNTER — Ambulatory Visit
Admission: RE | Admit: 2017-07-29 | Discharge: 2017-07-29 | Disposition: A | Discharge status: Home / Self Care | Payer: BLUE CROSS/BLUE SHIELD | Source: Ambulatory Visit | Attending: Obstetrics and Gynecology | Admitting: Obstetrics and Gynecology

## 2017-07-29 ENCOUNTER — Encounter
Admission: RE | Disposition: A | Discharge status: Home / Self Care | Payer: Self-pay | Source: Ambulatory Visit | Attending: Obstetrics and Gynecology

## 2017-07-29 ENCOUNTER — Encounter: Payer: Self-pay | Admitting: Obstetrics and Gynecology

## 2017-07-29 ENCOUNTER — Encounter: Payer: Self-pay | Admitting: *Deleted

## 2017-07-29 ENCOUNTER — Other Ambulatory Visit: Payer: Self-pay

## 2017-07-29 VITALS — BP 108/64 | Wt 170.0 lb

## 2017-07-29 DIAGNOSIS — O4692 Antepartum hemorrhage, unspecified, second trimester: Secondary | ICD-10-CM

## 2017-07-29 DIAGNOSIS — Z3A16 16 weeks gestation of pregnancy: Secondary | ICD-10-CM | POA: Diagnosis not present

## 2017-07-29 DIAGNOSIS — O99212 Obesity complicating pregnancy, second trimester: Secondary | ICD-10-CM | POA: Insufficient documentation

## 2017-07-29 DIAGNOSIS — O2342 Unspecified infection of urinary tract in pregnancy, second trimester: Secondary | ICD-10-CM | POA: Diagnosis not present

## 2017-07-29 DIAGNOSIS — O3432 Maternal care for cervical incompetence, second trimester: Secondary | ICD-10-CM

## 2017-07-29 DIAGNOSIS — O99210 Obesity complicating pregnancy, unspecified trimester: Secondary | ICD-10-CM

## 2017-07-29 DIAGNOSIS — K5904 Chronic idiopathic constipation: Secondary | ICD-10-CM

## 2017-07-29 DIAGNOSIS — O0990 Supervision of high risk pregnancy, unspecified, unspecified trimester: Secondary | ICD-10-CM

## 2017-07-29 DIAGNOSIS — Z6830 Body mass index (BMI) 30.0-30.9, adult: Secondary | ICD-10-CM

## 2017-07-29 HISTORY — PX: CERVICAL CERCLAGE: SHX1329

## 2017-07-29 LAB — POCT URINALYSIS DIPSTICK
BILIRUBIN UA: NEGATIVE
GLUCOSE UA: NEGATIVE
Ketones, UA: NEGATIVE
Leukocytes, UA: NEGATIVE
Nitrite, UA: NEGATIVE
Protein, UA: NEGATIVE
SPEC GRAV UA: 1.025 (ref 1.010–1.025)
Urobilinogen, UA: 0.2 E.U./dL
pH, UA: 5 (ref 5.0–8.0)

## 2017-07-29 LAB — TYPE AND SCREEN
ABO/RH(D): A POS
Antibody Screen: NEGATIVE

## 2017-07-29 SURGERY — CERCLAGE, CERVIX, VAGINAL APPROACH
Anesthesia: General

## 2017-07-29 MED ORDER — PROPOFOL 10 MG/ML IV BOLUS
INTRAVENOUS | Status: AC
  Filled 2017-07-29: qty 20

## 2017-07-29 MED ORDER — INDOMETHACIN 25 MG PO CAPS: 25.0000 mg | ORAL_CAPSULE | Freq: Four times a day (QID) | ORAL | 0 refills | Status: AC

## 2017-07-29 MED ORDER — PROPOFOL 10 MG/ML IV BOLUS
INTRAVENOUS | Status: DC | PRN
  Administered 2017-07-29: 150 mg via INTRAVENOUS
  Administered 2017-07-29: 30 mg via INTRAVENOUS

## 2017-07-29 MED ORDER — SUCCINYLCHOLINE CHLORIDE 20 MG/ML IJ SOLN
INTRAMUSCULAR | Status: DC | PRN
  Administered 2017-07-29: 80 mg via INTRAVENOUS

## 2017-07-29 MED ORDER — FENTANYL CITRATE (PF) 100 MCG/2ML IJ SOLN
INTRAMUSCULAR | Status: DC | PRN
  Administered 2017-07-29 (×2): 50 ug via INTRAVENOUS

## 2017-07-29 MED ORDER — LACTATED RINGERS IV SOLN
INTRAVENOUS | Status: DC
  Administered 2017-07-29: 16:00:00 via INTRAVENOUS

## 2017-07-29 MED ORDER — INDOMETHACIN 50 MG PO CAPS
100.0000 mg | ORAL_CAPSULE | Freq: Once | ORAL | Status: AC
  Administered 2017-07-29: 100 mg via ORAL
  Filled 2017-07-29: qty 2

## 2017-07-29 MED ORDER — PROPOFOL 10 MG/ML IV BOLUS
INTRAVENOUS | Status: AC
Start: 2017-07-29 — End: ?
  Filled 2017-07-29: qty 20

## 2017-07-29 MED ORDER — FENTANYL CITRATE (PF) 100 MCG/2ML IJ SOLN: 25.0000 ug | INTRAMUSCULAR | Status: DC | PRN

## 2017-07-29 MED ORDER — FENTANYL CITRATE (PF) 100 MCG/2ML IJ SOLN
INTRAMUSCULAR | Status: AC
  Filled 2017-07-29: qty 2

## 2017-07-29 MED ORDER — ONDANSETRON HCL 4 MG/2ML IJ SOLN: 4.0000 mg | Freq: Once | INTRAMUSCULAR | Status: DC | PRN

## 2017-07-29 SURGICAL SUPPLY — 15 items
CATH ROBINSON RED A/P 16FR (CATHETERS) ×2 IMPLANT
GLOVE BIOGEL PI IND STRL 6.5 (GLOVE) ×2 IMPLANT
GLOVE BIOGEL PI INDICATOR 6.5 (GLOVE) ×2
GLOVE SURG SYN 6.5 ES PF (GLOVE) ×4 IMPLANT
GOWN STRL REUS W/ TWL LRG LVL3 (GOWN DISPOSABLE) ×2 IMPLANT
GOWN STRL REUS W/TWL LRG LVL3 (GOWN DISPOSABLE) ×2
KIT TURNOVER CYSTO (KITS) ×2 IMPLANT
NS IRRIG 500ML POUR BTL (IV SOLUTION) ×2 IMPLANT
PACK DNC HYST (MISCELLANEOUS) ×2 IMPLANT
PAD OB MATERNITY 4.3X12.25 (PERSONAL CARE ITEMS) ×2 IMPLANT
PAD PREP 24X41 OB/GYN DISP (PERSONAL CARE ITEMS) ×2 IMPLANT
SUT MERSILENE 5MM BP 1 12 (SUTURE) IMPLANT
SUT POLY BUTTON 15MM (SUTURE) IMPLANT
SUT PROLENE 1 CT (SUTURE) ×2 IMPLANT
SUT PROLENE 2 TP 1 (SUTURE) ×2 IMPLANT

## 2017-07-29 NOTE — Interval H&P Note (Signed)
History and Physical Interval Note:  07/29/2017 6:17 PM  Cassie MoloneyFabiola Betsey HolidayBrambila Morales  has presented today for surgery, with the diagnosis of painless cervical dilation. The various methods of treatment have been discussed with the patient and family. After consideration of risks, benefits and other options for treatment, the patient has consented to  Procedure(s): CERCLAGE CERVICAL (N/A) as a surgical intervention .  The patient's history has been reviewed, patient examined, no change in status, stable for surgery.  I have reviewed the patient's chart and labs.  Questions were answered to the patient's satisfaction.    Bedside uterine monitoring for contractions was performed and no contractions were present.   Christanna R Schuman

## 2017-07-29 NOTE — Transfer of Care (Signed)
Immediate Anesthesia Transfer of Care Note  Patient: Cloretta NedFabiola Brambila Morales  Procedure(s) Performed: CERCLAGE CERVICAL (N/A )  Patient Location: PACU  Anesthesia Type:General  Level of Consciousness: awake, alert , oriented and patient cooperative  Airway & Oxygen Therapy: Patient Spontanous Breathing  Post-op Assessment: Report given to RN and Post -op Vital signs reviewed and stable  Post vital signs: Reviewed and stable  Last Vitals:  Vitals Value Taken Time  BP 120/80 07/29/2017  8:38 PM  Temp    Pulse 97 07/29/2017  8:38 PM  Resp 11 07/29/2017  8:38 PM  SpO2 99 % 07/29/2017  8:38 PM  Vitals shown include unvalidated device data.  Last Pain:  Vitals:   07/29/17 1526  TempSrc: Oral         Complications: No apparent anesthesia complications

## 2017-07-29 NOTE — Anesthesia Post-op Follow-up Note (Signed)
Anesthesia QCDR form completed.        

## 2017-07-29 NOTE — Anesthesia Procedure Notes (Signed)
Procedure Name: Intubation Date/Time: 07/29/2017 7:57 PM Performed by: Waldo LaineJustis, Savino Whisenant, CRNA Pre-anesthesia Checklist: Patient identified, Patient being monitored, Timeout performed, Emergency Drugs available and Suction available Patient Re-evaluated:Patient Re-evaluated prior to induction Oxygen Delivery Method: Circle system utilized Preoxygenation: Pre-oxygenation with 100% oxygen Induction Type: IV induction, Rapid sequence and Cricoid Pressure applied Laryngoscope Size: Miller and 2 Grade View: Grade I Tube type: Oral Tube size: 7.0 mm Number of attempts: 1 Airway Equipment and Method: Stylet Placement Confirmation: ETT inserted through vocal cords under direct vision,  positive ETCO2 and breath sounds checked- equal and bilateral Secured at: 21 cm Tube secured with: Tape Dental Injury: Teeth and Oropharynx as per pre-operative assessment

## 2017-07-29 NOTE — Progress Notes (Signed)
Pt c/o spotting that started on Friday and into Saturday, went to ER Saturday and was told everything in normal limits. Woke up Sunday with a single episode of bad cramping and then a little heavier spotting and today light spotting. Friday was bright red and yesterday dark red.

## 2017-07-29 NOTE — Anesthesia Preprocedure Evaluation (Signed)
Anesthesia Evaluation  Patient identified by MRN, date of birth, ID band Patient awake    Reviewed: Allergy & Precautions, H&P , NPO status , Patient's Chart, lab work & pertinent test results, reviewed documented beta blocker date and time   Airway Mallampati: II   Neck ROM: full    Dental  (+) Teeth Intact   Pulmonary neg pulmonary ROS,    Pulmonary exam normal        Cardiovascular negative cardio ROS Normal cardiovascular exam Rhythm:regular Rate:Normal     Neuro/Psych negative neurological ROS  negative psych ROS   GI/Hepatic negative GI ROS, Neg liver ROS,   Endo/Other  negative endocrine ROS  Renal/GU negative Renal ROS  negative genitourinary   Musculoskeletal   Abdominal   Peds  Hematology negative hematology ROS (+)   Anesthesia Other Findings History reviewed. No pertinent past medical history. Past Surgical History: No date: NO PAST SURGERIES BMI    Body Mass Index:  31.09 kg/m     Reproductive/Obstetrics negative OB ROS                             Anesthesia Physical Anesthesia Plan  ASA: II  Anesthesia Plan: General   Post-op Pain Management:    Induction:   PONV Risk Score and Plan:   Airway Management Planned:   Additional Equipment:   Intra-op Plan:   Post-operative Plan:   Informed Consent: I have reviewed the patients History and Physical, chart, labs and discussed the procedure including the risks, benefits and alternatives for the proposed anesthesia with the patient or authorized representative who has indicated his/her understanding and acceptance.   Dental Advisory Given  Plan Discussed with: CRNA  Anesthesia Plan Comments: (Pt. Is aware of the R/B of GA and the above mentioned procedure....including teratogenicity and spontaneous abortion.  JA)        Anesthesia Quick Evaluation

## 2017-07-29 NOTE — Progress Notes (Signed)
Fetal heart tones auscultated at 150bpm  in PACU by Dr Jerene PitchSchuman.

## 2017-07-29 NOTE — H&P (View-Only) (Signed)
Routine Prenatal Care Visit  Subjective  Cassie Garza is a 27 y.o. G1P0 at [redacted]w[redacted]d being seen today for ongoing prenatal care.  She is currently monitored for the following issues for this high-risk pregnancy and has Supervision of high risk pregnancy, antepartum; Obesity in pregnancy; BMI 30.0-30.9,adult; Chlamydia infection affecting pregnancy; UTI (urinary tract infection) during pregnancy; Vaginal bleeding in pregnancy, second trimester; and Chronic idiopathic constipation on their problem list.  ----------------------------------------------------------------------------------- Patient reports that she has had a small daily persistant vaginal spottin since 07/26/17. She was seen and evaluated in the ER for this problem on 4/13. At that time US showed her to have a cervical length of 3.6cm. She has been treated for a UTI and chlamydia infection recently. She reports that since taking her medication she has not been sexually active. She reports that her partner was treated as well. No sexual activity in more than 1 week. She reports she has had GI upset with the antibiotic for her UTI. She took all but one of her antibiotic pills on schedule and finished her antibiotics on Sunday. She also has had constipation. Her last bowel movement was Thursday.  Patient reports a one time sharp pain Sunday 07/28/17  which lasted for 3-4 minutes but then dissipated, no current pain.  Contractions: Not present. Vag. Bleeding: Small.   . Denies leaking of fluid.  ----------------------------------------------------------------------------------- The following portions of the patient's history were reviewed and updated as appropriate: allergies, current medications, past family history, past medical history, past social history, past surgical history and problem list. Problem list updated.   Objective  Blood pressure 108/64, weight 170 lb (77.1 kg), last menstrual period 04/08/2017. Pregravid weight 167  lb (75.8 kg) Total Weight Gain 3 lb (1.361 kg) Urinalysis: Urine Protein: Negative Urine Glucose: Negative  Fetal Status: Fetal Heart Rate (bpm): 151         General:  Alert, oriented and cooperative. Patient is in no acute distress.  Skin: Skin is warm and dry. No rash noted.   Cardiovascular: Normal heart rate noted  Respiratory: Normal respiratory effort, no problems with respiration noted  Abdomen: Soft, gravid, appropriate for gestational age. Pain/Pressure: Absent     Pelvic:  Cervical exam performed Dilation: 1 Effacement (%): 10 Station: -3 on speculum exam cervical os is visually dilated, small amount of bloody cervical mucus present at os. No active bleeding.   Extremities: Normal range of motion.     ental Status: Normal mood and affect. Normal behavior. Normal judgment and thought content.     Assessment   27 y.o. G1P0 at [redacted]w[redacted]d by  01/13/2018, by Last Menstrual Period presenting for work-in prenatal visit  Plan   FIRST Problems (from 07/05/17 to present)    Problem Noted Resolved   Obesity in pregnancy 07/15/2017 by Conard Novak, MD No   Overview Signed 07/15/2017  3:39 PM by Conard Novak, MD    [ ]  early 1h gtt      BMI 30.0-30.9,adult 07/15/2017 by Conard Novak, MD No   Chlamydia infection affecting pregnancy 07/15/2017 by Conard Novak, MD No   Overview Signed 07/15/2017  3:52 PM by Conard Novak, MD    - Treated 4/1 [ ]  needs TOC early May      UTI (urinary tract infection) during pregnancy 07/15/2017 by Conard Novak, MD No       Gestational age appropriate obstetric precautions including but not limited to vaginal bleeding, contractions, leaking of fluid and  fetal movement were reviewed in detail with the patient.    Painless cervical dilation. Discussed with patient options of progesterone therapy, expectant management and cerclage. Patient would like to move forward with cerclage placement. No current signs of chorioamnionitis or  uterine contractions. Will resent chlamydia/gonorrhea swab and urine culture. Stat CBC. Transvaginal US today .   Return in about 1 week (around 08/05/2017) for ROB.  Adelene Idlerhristanna Jaterrius Ricketson MD [facility], [facility] 07/29/2017, 1:45 PM

## 2017-07-29 NOTE — Progress Notes (Signed)
Routine Prenatal Care Visit  Subjective  Cassie Garza is a 27 y.o. G1P0 at [redacted]w[redacted]d being seen today for ongoing prenatal care.  She is currently monitored for the following issues for this high-risk pregnancy and has Supervision of high risk pregnancy, antepartum; Obesity in pregnancy; BMI 30.0-30.9,adult; Chlamydia infection affecting pregnancy; UTI (urinary tract infection) during pregnancy; Vaginal bleeding in pregnancy, second trimester; and Chronic idiopathic constipation on their problem list.  ----------------------------------------------------------------------------------- Patient reports that she has had a small daily persistant vaginal spottin since 07/26/17. She was seen and evaluated in the ER for this problem on 4/13. At that time US showed her to have a cervical length of 3.6cm. She has been treated for a UTI and chlamydia infection recently. She reports that since taking her medication she has not been sexually active. She reports that her partner was treated as well. No sexual activity in more than 1 week. She reports she has had GI upset with the antibiotic for her UTI. She took all but one of her antibiotic pills on schedule and finished her antibiotics on Sunday. She also has had constipation. Her last bowel movement was Thursday.  Patient reports a one time sharp pain Sunday 07/28/17  which lasted for 3-4 minutes but then dissipated, no current pain.  Contractions: Not present. Vag. Bleeding: Small.   . Denies leaking of fluid.  ----------------------------------------------------------------------------------- The following portions of the patient's history were reviewed and updated as appropriate: allergies, current medications, past family history, past medical history, past social history, past surgical history and problem list. Problem list updated.   Objective  Blood pressure 108/64, weight 170 lb (77.1 kg), last menstrual period 04/08/2017. Pregravid weight 167  lb (75.8 kg) Total Weight Gain 3 lb (1.361 kg) Urinalysis: Urine Protein: Negative Urine Glucose: Negative  Fetal Status: Fetal Heart Rate (bpm): 151         General:  Alert, oriented and cooperative. Patient is in no acute distress.  Skin: Skin is warm and dry. No rash noted.   Cardiovascular: Normal heart rate noted  Respiratory: Normal respiratory effort, no problems with respiration noted  Abdomen: Soft, gravid, appropriate for gestational age. Pain/Pressure: Absent     Pelvic:  Cervical exam performed Dilation: 1 Effacement (%): 10 Station: -3 on speculum exam cervical os is visually dilated, small amount of bloody cervical mucus present at os. No active bleeding.   Extremities: Normal range of motion.     ental Status: Normal mood and affect. Normal behavior. Normal judgment and thought content.     Assessment   27 y.o. G1P0 at [redacted]w[redacted]d by  01/13/2018, by Last Menstrual Period presenting for work-in prenatal visit  Plan   FIRST Problems (from 07/05/17 to present)    Problem Noted Resolved   Obesity in pregnancy 07/15/2017 by Conard Novak, MD No   Overview Signed 07/15/2017  3:39 PM by Conard Novak, MD    [ ]  early 1h gtt      BMI 30.0-30.9,adult 07/15/2017 by Conard Novak, MD No   Chlamydia infection affecting pregnancy 07/15/2017 by Conard Novak, MD No   Overview Signed 07/15/2017  3:52 PM by Conard Novak, MD    - Treated 4/1 [ ]  needs TOC early May      UTI (urinary tract infection) during pregnancy 07/15/2017 by Conard Novak, MD No       Gestational age appropriate obstetric precautions including but not limited to vaginal bleeding, contractions, leaking of fluid and  fetal movement were reviewed in detail with the patient.    Painless cervical dilation. Discussed with patient options of progesterone therapy, expectant management and cerclage. Patient would like to move forward with cerclage placement. No current signs of chorioamnionitis or  uterine contractions. Will resent chlamydia/gonorrhea swab and urine culture. Stat CBC. Transvaginal US today .   Return in about 1 week (around 08/05/2017) for ROB.  Adelene Idlerhristanna Schuman MD [facility], [facility] 07/29/2017, 1:45 PM

## 2017-07-29 NOTE — Op Note (Signed)
07/29/2017  8:51 PM  PATIENT:  Cassie Garza  27 y.o. female  PRE-OPERATIVE DIAGNOSIS:  painless cervical dilation, [redacted] weeks gestation  POST-OPERATIVE DIAGNOSIS:  same as preop  PROCEDURE:  CERCLAGE CERVICAL (N/A)  SURGEON: Schuman, Christanna R, MD - Primary  ASSISTANTS: none   ANESTHESIA:   general  EBL:  10 cc  COMPLICATIONS: None      INDICATIONS: Cassie Garza is a 27 yo G1P0 at 5916 weeks gestational age who presented today to the office with complaints of persistent vaginal spotting since last Friday. She was found to be visually and digitally dilated to 1 cm. She described painless dilation. TVUS showed a cervical length of 34 mm. She was counseled about her options and elected for management with a cervical cerclage.    FINDINGS: Dilated cervix to 1 cm. No active bleeding. No cervical discharge or signs of cervicitis.   DICTATION:  Patient was taken to the OR where anesthesia was established. She was positioned into the dorsal lithotomy position with her feet in [employer]. She was prepped and draped in the usual sterile manner. A weighted speculum was inserted into the posterior vaginal vault and a right angle retractor was used to visualize the cervix. The above findings were noted. A McDonald cerclage was then placed using #1 Prolene Monofilament suture. The anterior lip of the cervix was grasped at the 12 and 9 o'clock position with an allis clamp. The suture was passed from 12 to 9 o'clock through the cervical tissue. The allis clamps were then moved to the 9 and 6 o'clock positions and the suture was passed from the 9 to the 6 o'clock position. The allis clamps were then moved to the 6 and 3 o'clock positions and the uture was passed from the 6 to the 3 o'clock position. The allis clamps were then moved to the 3 and 12 o'clock positions and the suture was passed from the 3 to the 12 o'clock position. Twelve knots were then tied at the 12 o'clock position. Excellent  hemostasis was achieved throughout the procedure. The cervix was visually closed at the end of the procedure. All instruments were removed from the vagina and the procedure was discontinued. All counts were correct times two. The patient was taken to the recovery room in stable condition. There were no complications.    PLAN OF CARE: Discharge to home after PACU  PATIENT DISPOSITION:  PACU - hemodynamically stable.   Adelene Idlerhristanna Schuman MD [facility], [facility] 07/29/17 8:54 PM

## 2017-07-29 NOTE — Discharge Instructions (Signed)
Cervical Cerclage, Care After This sheet gives you information about how to care for yourself after your procedure. Your health care provider may also give you more specific instructions. If you have problems or questions, contact your health care provider. What can I expect after the procedure? After your procedure, it is common to have:  Cramping in your abdomen.  Mucus discharge for several days.  Painful urination (dysuria).  Small drops of blood coming from your vagina (spotting).  Follow these instructions at home:  Follow instructions from your health care provider about bed rest, if this applies. You may need to be on bed rest for up to 3 days.  Take over-the-counter and prescription medicines only as told by your health care provider.  Do not drive or use heavy machinery while taking prescription pain medicine.  Keep track of your vaginal discharge and watch for any changes. If you notice changes, tell your health care provider.  Avoid physical activities and exercise until your health care provider approves. Ask your health care provider what activities are safe for you.  Until your health care provider approves: ? Do not douche. ? Do not have sexual intercourse.  Keep all pre-birth (prenatal) visits and all follow-up visits as told by your health care provider. This is important. You will probably have weekly visits to have your cervix checked, and you may need an ultrasound. Contact a health care provider if:  You have abnormal or bad-smelling vaginal discharge, such as clots.  You develop a rash on your skin. This may look like redness and swelling.  You become light-headed or feel like you are going to faint.  You have abdominal pain that does not get better with medicine.  You have persistent nausea or vomiting. Get help right away if:  You have vaginal bleeding that is heavier or more frequent than spotting.  You are leaking fluid or have a gush of fluid  from your vagina (your water breaks).  You have a fever or chills.  You faint.  You have uterine contractions. These may feel like: ? A back ache. ? Lower abdominal pain. ? Mild cramps, similar to menstrual cramps. ? Tightening or pressure in your abdomen.  You think that your baby is not moving as much as usual, or you cannot feel your baby move.  You have chest pain.  You have shortness of breath. This information is not intended to replace advice given to you by your health care provider. Make sure you discuss any questions you have with your health care provider. Document Released: 01/21/2013 Document Revised: 11/30/2015 Document Reviewed: 11/04/2015 Elsevier Interactive Patient Education  2018 [employer].   Cerclaje cervical, cuidados posteriores (Cervical Cerclage, Care After) Lea esta informacin sobre cmo cuidarse despus del procedimiento. El mdico tambin podr darle instrucciones ms especficas. Comunquese con el mdico si tiene problemas o preguntas. QU ESPERAR DESPUS DEL PROCEDIMIENTO Despus del procedimiento, es comn tener los siguientes sntomas:  Calambres en el abdomen.  Secrecin de [employer].  Dolor al Geographical information systems officer (disuria).  Gotas de sangre por la vagina (goteo). INSTRUCCIONES PARA EL CUIDADO EN EL HOGAR  Siga las indicaciones del mdico acerca del reposo en cama, si corresponde. Es posible que Radio broadcast assistant reposo en cama por [employer].  Tome los medicamentos de venta libre y los recetados solamente como se lo haya indicado el mdico.  No conduzca ni use maquinaria pesada mientras toma analgsicos recetados.  Controle el flujo vaginal y verifique si hay  algn cambio. Si los hay, informe a su mdico.  Evite las actividades y los ejercicios fsicos hasta que el mdico lo autorice. Pregntele al mdico qu actividades son seguras para usted.  Hasta que el mdico la autorice: ? No se haga duchas vaginales. ? No tenga  relaciones sexuales.  Concurra a todas las visitas de control prenatal y a todas las visitas de seguimiento como se lo haya indicado el mdico. Esto es importante. Probablemente deba realizarse un estudio del cuello del tero todas las [place], adems de [place]. SOLICITE ATENCIN MDICA SI:  Tiene flujo vaginal anormal o con mal olor, por ejemplo, cogulos.  Tiene una erupcin cutnea. Esto puede ser enrojecimiento o hinchazn.  Se siente mareada o como si se fuera a desmayar.  El dolor abdominal no mejora con medicamentos.  Tiene nuseas o vmitos persistentes. SOLICITE ATENCIN MDICA DE INMEDIATO SI:  Tiene hemorragia vaginal ms abundante o ms frecuente que un goteo.  Tiene una prdida importante o Scientist, research (medical)le sale lquido a borbotones por la vagina (rompe aguas).  Tiene fiebre o siente escalofros.  Se desmaya.  Siente contracciones en el tero. Estas pueden sentirse como lo siguiente: ? Dolor de espalda. ? Dolor en la zona inferior del abdomen. ? Calambres leves, similares a los Sonic Automotivedolores menstruales. ? Tirantez o presin en el abdomen.  Siente que el beb no se mueve tanto como antes o no puede sentir los movimientos del beb.  Siente dolor en el pecho.  Le falta el aire. Esta informacin no tiene Theme park managercomo fin reemplazar el consejo del mdico. Asegrese de hacerle al mdico cualquier pregunta que tenga. Document Released: 01/21/2013 Document Revised: 07/25/2015 Document Reviewed: 11/04/2015 Elsevier Interactive Patient Education  [employer]2018 Elsevier Inc.

## 2017-07-30 ENCOUNTER — Encounter: Payer: Self-pay | Admitting: Obstetrics and Gynecology

## 2017-07-30 LAB — CBC WITH DIFFERENTIAL/PLATELET
BASOS: 0 %
Basophils Absolute: 0 10*3/uL (ref 0.0–0.2)
EOS (ABSOLUTE): 0.2 10*3/uL (ref 0.0–0.4)
EOS: 2 %
HEMATOCRIT: 36.6 % (ref 34.0–46.6)
Hemoglobin: 12.6 g/dL (ref 11.1–15.9)
IMMATURE GRANS (ABS): 0 10*3/uL (ref 0.0–0.1)
IMMATURE GRANULOCYTES: 0 %
Lymphocytes Absolute: 2.2 10*3/uL (ref 0.7–3.1)
Lymphs: 25 %
MCH: 30.3 pg (ref 26.6–33.0)
MCHC: 34.4 g/dL (ref 31.5–35.7)
MCV: 88 fL (ref 79–97)
Monocytes Absolute: 0.8 10*3/uL (ref 0.1–0.9)
Monocytes: 9 %
NEUTROS PCT: 64 %
Neutrophils Absolute: 5.7 10*3/uL (ref 1.4–7.0)
Platelets: 242 10*3/uL (ref 150–379)
RBC: 4.16 x10E6/uL (ref 3.77–5.28)
RDW: 15.3 % (ref 12.3–15.4)
WBC: 8.9 10*3/uL (ref 3.4–10.8)

## 2017-07-31 LAB — URINE CULTURE

## 2017-07-31 LAB — GC/CHLAMYDIA PROBE AMP
Chlamydia trachomatis, NAA: NEGATIVE
NEISSERIA GONORRHOEAE BY PCR: NEGATIVE

## 2017-08-01 NOTE — Anesthesia Postprocedure Evaluation (Signed)
Anesthesia Post Note  Patient: Cassie Garza  Procedure(s) Performed: CERCLAGE CERVICAL (N/A )  Patient location during evaluation: PACU Anesthesia Type: General Level of consciousness: awake and alert Pain management: pain level controlled Vital Signs Assessment: post-procedure vital signs reviewed and stable Respiratory status: spontaneous breathing, nonlabored ventilation, respiratory function stable and patient connected to nasal cannula oxygen Cardiovascular status: blood pressure returned to baseline and stable Postop Assessment: no apparent nausea or vomiting Anesthetic complications: no     Last Vitals:  Vitals:   07/29/17 2138 07/29/17 2153  BP: 113/78 113/79  Pulse: 82 72  Resp: 18 16  Temp:    SpO2: 100% 100%    Last Pain:  Vitals:   07/29/17 2153  TempSrc:   PainSc: 3                  Yevette EdwardsJames G Adams

## 2017-08-05 ENCOUNTER — Encounter: Payer: BLUE CROSS/BLUE SHIELD | Admitting: Obstetrics and Gynecology

## 2017-08-06 ENCOUNTER — Ambulatory Visit (INDEPENDENT_AMBULATORY_CARE_PROVIDER_SITE_OTHER): Payer: BLUE CROSS/BLUE SHIELD | Admitting: Obstetrics and Gynecology

## 2017-08-06 VITALS — BP 116/66 | Wt 173.0 lb

## 2017-08-06 DIAGNOSIS — Z3A17 17 weeks gestation of pregnancy: Secondary | ICD-10-CM

## 2017-08-06 DIAGNOSIS — O0990 Supervision of high risk pregnancy, unspecified, unspecified trimester: Secondary | ICD-10-CM

## 2017-08-06 DIAGNOSIS — N760 Acute vaginitis: Secondary | ICD-10-CM

## 2017-08-06 DIAGNOSIS — O99210 Obesity complicating pregnancy, unspecified trimester: Secondary | ICD-10-CM

## 2017-08-06 DIAGNOSIS — Z6830 Body mass index (BMI) 30.0-30.9, adult: Secondary | ICD-10-CM

## 2017-08-06 DIAGNOSIS — O3432 Maternal care for cervical incompetence, second trimester: Secondary | ICD-10-CM | POA: Insufficient documentation

## 2017-08-06 MED ORDER — TERCONAZOLE 0.4 % VA CREA: 1.0000 | TOPICAL_CREAM | Freq: Every day | VAGINAL | 0 refills | Status: DC

## 2017-08-06 NOTE — Progress Notes (Signed)
ROB   Cerclage area itching

## 2017-08-06 NOTE — Patient Instructions (Signed)
Cervical Cerclage, Care After  This sheet gives you information about how to care for yourself after your procedure. Your health care provider may also give you more specific instructions. If you have problems or questions, contact your health care provider.  What can I expect after the procedure?  After your procedure, it is common to have:   Cramping in your abdomen.   Mucus discharge for several days.   Painful urination (dysuria).   Small drops of blood coming from your vagina (spotting).    Follow these instructions at home:   Follow instructions from your health care provider about bed rest, if this applies. You may need to be on bed rest for up to 3 days.   Take over-the-counter and prescription medicines only as told by your health care provider.   Do not drive or use heavy machinery while taking prescription pain medicine.   Keep track of your vaginal discharge and watch for any changes. If you notice changes, tell your health care provider.   Avoid physical activities and exercise until your health care provider approves. Ask your health care provider what activities are safe for you.   Until your health care provider approves:  ? Do not douche.  ? Do not have sexual intercourse.   Keep all pre-birth (prenatal) visits and all follow-up visits as told by your health care provider. This is important. You will probably have weekly visits to have your cervix checked, and you may need an ultrasound.    Contact a health care provider if:   You have abnormal or bad-smelling vaginal discharge, such as clots.   You develop a rash on your skin. This may look like redness and swelling.   You become light-headed or feel like you are going to faint.   You have abdominal pain that does not get better with medicine.   You have persistent nausea or vomiting.    Get help right away if:   You have vaginal bleeding that is heavier or more frequent than spotting.   You are leaking fluid or have a gush of  fluid from your vagina (your water breaks).   You have a fever or chills.   You faint.   You have uterine contractions. These may feel like:  ? A back ache.  ? Lower abdominal pain.  ? Mild cramps, similar to menstrual cramps.  ? Tightening or pressure in your abdomen.   You think that your baby is not moving as much as usual, or you cannot feel your baby move.   You have chest pain.   You have shortness of breath.  This information is not intended to replace advice given to you by your health care provider. Make sure you discuss any questions you have with your health care provider.    Document Released: 01/21/2013 Document Revised: 11/30/2015 Document Reviewed: 11/04/2015  Elsevier Interactive Patient Education  2018 Elsevier Inc.

## 2017-08-06 NOTE — Progress Notes (Signed)
Routine Prenatal Care Visit  Subjective  Cassie Garza is a 27 y.o. G1P0 at 528w1d being seen today for ongoing prenatal care.  She is currently monitored for the following issues for this high-risk pregnancy and has Supervision of high risk pregnancy, antepartum; Obesity in pregnancy; BMI 30.0-30.9,adult; Chlamydia infection affecting pregnancy; UTI (urinary tract infection) during pregnancy; Vaginal bleeding in pregnancy, second trimester; Chronic idiopathic constipation; and Cervical cerclage suture present in second trimester on their problem list.  ----------------------------------------------------------------------------------- Patient reports a small occasional vaginal itch. No pain . No bleeding.  Contractions: Not present. Vag. Bleeding: None.   . Denies leaking of fluid.  ----------------------------------------------------------------------------------- The following portions of the patient's history were reviewed and updated as appropriate: allergies, current medications, past family history, past medical history, past social history, past surgical history and problem list. Problem list updated.   Objective  Blood pressure 116/66, weight 173 lb (78.5 kg), last menstrual period 04/08/2017. Pregravid weight 167 lb (75.8 kg) Total Weight Gain 6 lb (2.722 kg) Urinalysis: Urine Protein: Negative Urine Glucose: Negative  Fetal Status: Fetal Heart Rate (bpm): 144         General:  Alert, oriented and cooperative. Patient is in no acute distress.  Skin: Skin is warm and dry. No rash noted.   Cardiovascular: Normal heart rate noted  Respiratory: Normal respiratory effort, no problems with respiration noted  Abdomen: Soft, gravid, appropriate for gestational age. Pain/Pressure: Absent     Pelvic:   Speculum exam showed cerclage in normal position, no tension, no dilation past the cerclage. No vaginal bleeding. Thick white discharge on vaginal walls.        Extremities:  Normal range of motion.     Mental Status: Normal mood and affect. Normal behavior. Normal judgment and thought content.     Assessment   27 y.o. G1P0 at 828w1d by  01/13/2018, by Last Menstrual Period presenting for work-in prenatal visit  Plan   FIRST Problems (from 07/05/17 to present)    Problem Noted Resolved   Cervical cerclage suture present in second trimester 08/06/2017 by Natale MilchSchuman, Michel Hendon R, MD No   Obesity in pregnancy 07/15/2017 by Conard NovakJackson, Stephen D, MD No   Overview Signed 07/15/2017  3:39 PM by Conard NovakJackson, Stephen D, MD    [ ]  early 1h gtt      BMI 30.0-30.9,adult 07/15/2017 by Conard NovakJackson, Stephen D, MD No   Chlamydia infection affecting pregnancy 07/15/2017 by Conard NovakJackson, Stephen D, MD No   Overview Signed 07/15/2017  3:52 PM by Conard NovakJackson, Stephen D, MD    - Treated 4/1 [ ]  needs TOC early May      UTI (urinary tract infection) during pregnancy 07/15/2017 by Conard NovakJackson, Stephen D, MD No   Supervision of high risk pregnancy, antepartum 07/08/2017 by Tresea MallGledhill, Jane, CNM No   Overview Addendum 08/06/2017 10:26 AM by Natale MilchSchuman, Yadhira Mckneely R, MD    Clinic [facility] Prenatal Labs  Dating  LMP= 14 week US Blood type: --/--/A POS (04/15 1627)   Genetic Screen Declined Antibody:NEG (04/15 1627)  Anatomic US  Rubella: 5.77 (04/01 1602)   Varicella: Immune  GTT Early:120                Third trimester:  RPR: Non Reactive (04/01 1602)   Rhogam  HBsAg: Negative (04/01 1602)   TDaP vaccine                        Flu Shot: HIV: Non Reactive (04/01  1602)   Baby Food                                GBS:   Contraception  Pap:  CBB     CS/VBAC NA CERVICAL CERCLAGE  Support Person [name]              Gestational age appropriate obstetric precautions including but not limited to vaginal bleeding, contractions, leaking of fluid and fetal movement were reviewed in detail with the patient.    Yeast infection, will send NUswab for confirmation. Terazol 7 prescription sent. No follow-ups on  file.  Natale Milch MD [facility], [facility] 08/06/2017, 11:51 AM

## 2017-08-09 LAB — NUSWAB BV AND CANDIDA, NAA
CANDIDA ALBICANS, NAA: POSITIVE — AB
Candida glabrata, NAA: NEGATIVE

## 2017-08-11 NOTE — Progress Notes (Signed)
Patient is positive for yeast infection, was given terazol prescription in office.

## 2017-08-16 ENCOUNTER — Ambulatory Visit (INDEPENDENT_AMBULATORY_CARE_PROVIDER_SITE_OTHER): Payer: BLUE CROSS/BLUE SHIELD | Admitting: Obstetrics & Gynecology

## 2017-08-16 ENCOUNTER — Ambulatory Visit (INDEPENDENT_AMBULATORY_CARE_PROVIDER_SITE_OTHER): Payer: BLUE CROSS/BLUE SHIELD

## 2017-08-16 VITALS — BP 120/80 | Wt 174.0 lb

## 2017-08-16 DIAGNOSIS — Z3A18 18 weeks gestation of pregnancy: Secondary | ICD-10-CM | POA: Diagnosis not present

## 2017-08-16 DIAGNOSIS — O0990 Supervision of high risk pregnancy, unspecified, unspecified trimester: Secondary | ICD-10-CM

## 2017-08-16 DIAGNOSIS — Z362 Encounter for other antenatal screening follow-up: Secondary | ICD-10-CM

## 2017-08-16 DIAGNOSIS — O3432 Maternal care for cervical incompetence, second trimester: Secondary | ICD-10-CM

## 2017-08-16 NOTE — Patient Instructions (Signed)

## 2017-08-16 NOTE — Progress Notes (Signed)
  Subjective  Fetal Movement? yes Contractions? no Leaking Fluid? no Vaginal Bleeding? no Cerclage- no sx's Does have issues still w nausea and fatigue at work Objective  BP 120/80   Wt 174 lb (78.9 kg)   LMP 04/08/2017   BMI 31.83 kg/m  General: NAD Pumonary: no increased work of breathing Abdomen: gravid, non-tender Extremities: no edema Psychiatric: mood appropriate, affect full  Assessment  27 y.o. G1P0 at [redacted]w[redacted]d by  01/13/2018, by Last Menstrual Period presenting for routine prenatal visit  Plan   Problem List Items Addressed This Visit      Genitourinary   Cervical cerclage suture present in second trimester     Other   Supervision of high risk pregnancy, antepartum    Other Visit Diagnoses    [redacted] weeks gestation of pregnancy    -  Primary   Relevant Orders   US OB Follow Up   Encounter for other antenatal screening follow-up       Relevant Orders   US OB Follow Up    Review of ULTRASOUND. I have personally reviewed images and report of recent ultrasound done at [facility]. There is a singleton gestation with subjectively normal amniotic fluid volume. The fetal biometry correlates with established dating. Detailed evaluation of the fetal anatomy was performed.The fetal anatomical survey appears within normal limits within the resolution of ultrasound as described above.  It must be noted that a normal ultrasound is unable to rule out fetal aneuploidy.    CERCLAGE- cont to monitor cervical lengths    [place] 2 weeks  FMLA for intermediate during pregnancy limitiations  Annamarie Major, MD, [facility], [facility] 08/16/2017  5:04 PM

## 2017-08-26 ENCOUNTER — Other Ambulatory Visit: Payer: Self-pay | Admitting: Obstetrics & Gynecology

## 2017-08-26 DIAGNOSIS — Z3A18 18 weeks gestation of pregnancy: Secondary | ICD-10-CM

## 2017-08-26 DIAGNOSIS — O3432 Maternal care for cervical incompetence, second trimester: Secondary | ICD-10-CM

## 2017-08-26 DIAGNOSIS — O26872 Cervical shortening, second trimester: Secondary | ICD-10-CM

## 2017-08-26 DIAGNOSIS — Z362 Encounter for other antenatal screening follow-up: Secondary | ICD-10-CM

## 2017-08-28 ENCOUNTER — Ambulatory Visit (INDEPENDENT_AMBULATORY_CARE_PROVIDER_SITE_OTHER): Payer: BLUE CROSS/BLUE SHIELD | Admitting: Obstetrics and Gynecology

## 2017-08-28 ENCOUNTER — Ambulatory Visit (INDEPENDENT_AMBULATORY_CARE_PROVIDER_SITE_OTHER): Payer: BLUE CROSS/BLUE SHIELD

## 2017-08-28 VITALS — BP 100/60 | Wt 176.0 lb

## 2017-08-28 DIAGNOSIS — Z3A20 20 weeks gestation of pregnancy: Secondary | ICD-10-CM

## 2017-08-28 DIAGNOSIS — O26872 Cervical shortening, second trimester: Secondary | ICD-10-CM

## 2017-08-28 DIAGNOSIS — O0990 Supervision of high risk pregnancy, unspecified, unspecified trimester: Secondary | ICD-10-CM

## 2017-08-28 DIAGNOSIS — Z362 Encounter for other antenatal screening follow-up: Secondary | ICD-10-CM | POA: Diagnosis not present

## 2017-08-28 DIAGNOSIS — O0992 Supervision of high risk pregnancy, unspecified, second trimester: Secondary | ICD-10-CM | POA: Diagnosis not present

## 2017-08-28 DIAGNOSIS — O3432 Maternal care for cervical incompetence, second trimester: Secondary | ICD-10-CM | POA: Diagnosis not present

## 2017-08-28 DIAGNOSIS — Z3A18 18 weeks gestation of pregnancy: Secondary | ICD-10-CM

## 2017-08-28 NOTE — Progress Notes (Signed)
Routine Prenatal Care Visit  Subjective  Cassie Garza is a 27 y.o. G1P0 at [redacted]w[redacted]d being seen today for ongoing prenatal care.  She is currently monitored for the following issues for this high-risk pregnancy and has Supervision of high risk pregnancy, antepartum; Obesity in pregnancy; BMI 30.0-30.9,adult; Chlamydia infection affecting pregnancy; UTI (urinary tract infection) during pregnancy; Vaginal bleeding in pregnancy, second trimester; Chronic idiopathic constipation; and Cervical cerclage suture present in second trimester on their problem list.  ----------------------------------------------------------------------------------- Patient reports occasional left or right side pressure, lasts for about 1 mintue. .   Contractions: Not present. Vag. Bleeding: None.   . Denies leaking of fluid.  ----------------------------------------------------------------------------------- The following portions of the patient's history were reviewed and updated as appropriate: allergies, current medications, past family history, past medical history, past social history, past surgical history and problem list. Problem list updated.   Objective  Blood pressure 100/60, weight 176 lb (79.8 kg), last menstrual period 04/08/2017. Pregravid weight 167 lb (75.8 kg) Total Weight Gain 9 lb (4.082 kg) Urinalysis: Urine Protein: Negative Urine Glucose: Negative  Fetal Status: Fetal Heart Rate (bpm): 144         General:  Alert, oriented and cooperative. Patient is in no acute distress.  Skin: Skin is warm and dry. No rash noted.   Cardiovascular: Normal heart rate noted  Respiratory: Normal respiratory effort, no problems with respiration noted  Abdomen: Soft, gravid, appropriate for gestational age. Pain/Pressure: Present     Pelvic:  Cervical exam deferred        Extremities: Normal range of motion.     ental Status: Normal mood and affect. Normal behavior. Normal judgment and thought content.     US Ob Follow Up  Result Date: 08/28/2017 ULTRASOUND REPORT Patient Name: Cassie Garza DOB: December 11, 1990 MRN: [id number] Location: [facility] Date of Service: 08/28/2017 Indications:F/U Anatomy and cervical length Findings: Mason Jim intrauterine pregnancy is visualized with FHR at 144 BPM. Fetal presentation is Cephalic. Placenta: Posterior, grade 0. AFI: subjectively normal. Anatomic survey is complete. Transvaginal cervical length performed. Cervical length measures  4.77 cm in the shortest dimension. There is no change with fundal pressure. No funneling is present. The cerclage is seen in the distal end of the cervix Impression: 1. [redacted]w[redacted]d viable [place] Intrauterine pregnancy by previously established criteria. 2. Cervical length is 4.77 cm. 2. Normal Anatomy Scan is now complete Recommendations: 1.Clinical correlation with the patient's History and Physical Exam. Willette Alma, RDMS, RVT I have reviewed this ultrasound and the report. I agree with the above assessment and plan. Adelene Idler MD [facility] 08/28/17 5:01 PM      Assessment   26 y.o. G1P0 at [redacted]w[redacted]d by  01/13/2018, by Last Menstrual Period presenting for routine prenatal visit  Plan   FIRST Problems (from 07/05/17 to present)    Problem Noted Resolved   Cervical cerclage suture present in second trimester 08/06/2017 by Natale Milch, MD No   Obesity in pregnancy 07/15/2017 by Conard Novak, MD No   Overview Signed 07/15/2017  3:39 PM by Conard Novak, MD     early 1h gtt      BMI 30.0-30.9,adult 07/15/2017 by Conard Novak, MD No   Chlamydia infection affecting pregnancy 07/15/2017 by Conard Novak, MD No   Overview Signed 07/15/2017  3:52 PM by Conard Novak, MD    - Treated 4/1  needs TOC early May      UTI (  urinary tract infection) during pregnancy 07/15/2017 by Conard Novak, MD No   Supervision of high risk pregnancy, antepartum  07/08/2017 by Tresea Mall, CNM No   Overview Addendum 08/06/2017 10:26 AM by Natale Milch, MD    Clinic [facility] Prenatal Labs  Dating  LMP= 14 week [place] Blood type: --/--/A POS (04/15 1627)   Genetic Screen Declined Antibody:NEG (04/15 1627)  Anatomic [place]  Rubella: 5.77 (04/01 1602)   Varicella: Immune  GTT Early:120                Third trimester:  RPR: Non Reactive (04/01 1602)   Rhogam  HBsAg: Negative (04/01 1602)   TDaP vaccine                        Flu Shot: HIV: Non Reactive (04/01 1602)   Baby Food                                GBS:   Contraception  Pap:  CBB     CS/VBAC NA CERVICAL CERCLAGE  Support Person [name]               Gestational age appropriate obstetric precautions including but not limited to vaginal bleeding, contractions, leaking of fluid and fetal movement were reviewed in detail with the patient.    TVUS today shows 5cm cervical length. Reassuring. Patient reassured.  Will perfrom speculum exam at next visit.  Return in about 1 month (around 09/25/2017).  Adelene Idler MD [facility], [facility] 08/28/17 5:31 PM

## 2017-08-28 NOTE — Progress Notes (Signed)
Pt c/o pressures on both sides and lower abdomen. F/u anatomy scan today.

## 2017-08-28 NOTE — Patient Instructions (Signed)
Second Trimester of Pregnancy The second trimester is from week 13 through week 28, month 4 through 6. This is often the time in pregnancy that you feel your best. Often times, morning sickness has lessened or quit. You may have more energy, and you may get hungry more often. Your unborn baby (fetus) is growing rapidly. At the end of the sixth month, he or she is about 9 inches long and weighs about 1 pounds. You will likely feel the baby move (quickening) between 18 and 20 weeks of pregnancy. Follow these instructions at home:  Avoid all smoking, herbs, and alcohol. Avoid drugs not approved by your doctor.  Do not use any tobacco products, including cigarettes, chewing tobacco, and electronic cigarettes. If you need help quitting, ask your doctor. You may get counseling or other support to help you quit.  Only take medicine as told by your doctor. Some medicines are safe and some are not during pregnancy.  Exercise only as told by your doctor. Stop exercising if you start having cramps.  Eat regular, healthy meals.  Wear a good support bra if your breasts are tender.  Do not use hot tubs, steam rooms, or saunas.  Wear your seat belt when driving.  Avoid raw meat, uncooked cheese, and liter boxes and soil used by cats.  Take your prenatal vitamins.  Take 1500-2000 milligrams of calcium daily starting at the 20th week of pregnancy until you deliver your baby.  Try taking medicine that helps you poop (stool softener) as needed, and if your doctor approves. Eat more fiber by eating fresh fruit, vegetables, and whole grains. Drink enough fluids to keep your pee (urine) clear or pale yellow.  Take warm water baths (sitz baths) to soothe pain or discomfort caused by hemorrhoids. Use hemorrhoid cream if your doctor approves.  If you have puffy, bulging veins (varicose veins), wear support hose. Raise (elevate) your feet for 15 minutes, 3-4 times a day. Limit salt in your diet.  Avoid heavy  lifting, wear low heals, and sit up straight.  Rest with your legs raised if you have leg cramps or low back pain.  Visit your dentist if you have not gone during your pregnancy. Use a soft toothbrush to brush your teeth. Be gentle when you floss.  You can have sex (intercourse) unless your doctor tells you not to.  Go to your doctor visits. Get help if:  You feel dizzy.  You have mild cramps or pressure in your lower belly (abdomen).  You have a nagging pain in your belly area.  You continue to feel sick to your stomach (nauseous), throw up (vomit), or have watery poop (diarrhea).  You have bad smelling fluid coming from your vagina.  You have pain with peeing (urination). Get help right away if:  You have a fever.  You are leaking fluid from your vagina.  You have spotting or bleeding from your vagina.  You have severe belly cramping or pain.  You lose or gain weight rapidly.  You have trouble catching your breath and have chest pain.  You notice sudden or extreme puffiness (swelling) of your face, hands, ankles, feet, or legs.  You have not felt the baby move in over an hour.  You have severe headaches that do not go away with medicine.  You have vision changes. This information is not intended to replace advice given to you by your health care provider. Make sure you discuss any questions you have with your health care   provider. Document Released: 06/27/2009 Document Revised: 09/08/2015 Document Reviewed: 06/03/2012 Elsevier Interactive Patient Education  2017 Elsevier Inc.  

## 2017-09-26 ENCOUNTER — Encounter: Payer: Self-pay | Admitting: Obstetrics and Gynecology

## 2017-09-27 ENCOUNTER — Encounter: Payer: Self-pay | Admitting: Obstetrics and Gynecology

## 2017-09-27 ENCOUNTER — Ambulatory Visit (INDEPENDENT_AMBULATORY_CARE_PROVIDER_SITE_OTHER): Payer: Self-pay | Admitting: Obstetrics and Gynecology

## 2017-09-27 ENCOUNTER — Encounter: Payer: BLUE CROSS/BLUE SHIELD | Admitting: Obstetrics and Gynecology

## 2017-09-27 VITALS — BP 111/73 | HR 87 | Wt 185.7 lb

## 2017-09-27 DIAGNOSIS — O0992 Supervision of high risk pregnancy, unspecified, second trimester: Secondary | ICD-10-CM

## 2017-09-27 DIAGNOSIS — Z3A24 24 weeks gestation of pregnancy: Secondary | ICD-10-CM

## 2017-09-27 DIAGNOSIS — Z3402 Encounter for supervision of normal first pregnancy, second trimester: Secondary | ICD-10-CM

## 2017-09-27 LAB — POCT URINALYSIS DIPSTICK
Bilirubin, UA: NEGATIVE
GLUCOSE UA: NEGATIVE
KETONES UA: NEGATIVE
Nitrite, UA: NEGATIVE
Odor: NEGATIVE
PH UA: 7.5 (ref 5.0–8.0)
Protein, UA: NEGATIVE
RBC UA: NEGATIVE
SPEC GRAV UA: 1.015 (ref 1.010–1.025)
UROBILINOGEN UA: 0.2 U/dL

## 2017-09-27 NOTE — Progress Notes (Signed)
ROB: Patient transferred from [facility].  She has a first pregnancy cerclage in place.  Most recent ultrasound shows good cervical length.  Patient asymptomatic.  Patient taking vitamins as directed and up-to-date on prenatal care.  UTI and chlamydia treated earlier in pregnancy.  Test of cure negative.  Patient not sexually active because of cerclage.  Prenatal care to date reviewed and discussed in detail.  Follow-up 4 weeks.  1 hour GCT next visit.

## 2017-09-27 NOTE — Progress Notes (Signed)
[facility].   NO complaints.

## 2017-10-28 ENCOUNTER — Other Ambulatory Visit: Payer: Self-pay

## 2017-10-28 ENCOUNTER — Ambulatory Visit (INDEPENDENT_AMBULATORY_CARE_PROVIDER_SITE_OTHER): Payer: Self-pay | Admitting: Obstetrics and Gynecology

## 2017-10-28 VITALS — BP 116/72 | HR 83 | Wt 191.8 lb

## 2017-10-28 DIAGNOSIS — Z3482 Encounter for supervision of other normal pregnancy, second trimester: Secondary | ICD-10-CM

## 2017-10-28 DIAGNOSIS — Z130 Encounter for screening for diseases of the blood and blood-forming organs and certain disorders involving the immune mechanism: Secondary | ICD-10-CM

## 2017-10-28 DIAGNOSIS — O3433 Maternal care for cervical incompetence, third trimester: Secondary | ICD-10-CM

## 2017-10-28 DIAGNOSIS — Z131 Encounter for screening for diabetes mellitus: Secondary | ICD-10-CM

## 2017-10-28 DIAGNOSIS — O0993 Supervision of high risk pregnancy, unspecified, third trimester: Secondary | ICD-10-CM

## 2017-10-28 DIAGNOSIS — Z6830 Body mass index (BMI) 30.0-30.9, adult: Secondary | ICD-10-CM

## 2017-10-28 DIAGNOSIS — Z3A28 28 weeks gestation of pregnancy: Secondary | ICD-10-CM

## 2017-10-28 DIAGNOSIS — Z23 Encounter for immunization: Secondary | ICD-10-CM

## 2017-10-28 LAB — POCT URINALYSIS DIPSTICK
BILIRUBIN UA: NEGATIVE
Blood, UA: NEGATIVE
GLUCOSE UA: NEGATIVE
KETONES UA: NEGATIVE
Nitrite, UA: NEGATIVE
PH UA: 7 (ref 5.0–8.0)
Protein, UA: NEGATIVE
Spec Grav, UA: <=1.005 — AB (ref 1.010–1.025)
UROBILINOGEN UA: 0.2 U/dL

## 2017-10-28 MED ORDER — TETANUS-DIPHTH-ACELL PERTUSSIS 5-2.5-18.5 LF-MCG/0.5 IM SUSP
0.5000 mL | Freq: Once | INTRAMUSCULAR | Status: AC
  Administered 2017-10-28: 0.5 mL via INTRAMUSCULAR

## 2017-10-28 MED ORDER — CONCEPT DHA 53.5-38-1 MG PO CAPS: 1.0000 | ORAL_CAPSULE | Freq: Every day | ORAL | 6 refills | Status: DC

## 2017-10-28 NOTE — Progress Notes (Signed)
ROB: Doing well, no complaints. Needs refill on prenatal vitamins. Discussed FMLA, intermittent leave with patient as relates to her job. For 28 week labs today.  Desires to breastfeed.  Desires unsure method for contraception, handout given. For Tdap today, signed blood consent. RTC in 2 weeks.

## 2017-10-28 NOTE — Progress Notes (Signed)
ROB-pt stated that she is doing well no complaints.  

## 2017-10-29 LAB — CBC
HEMATOCRIT: 35.1 % (ref 34.0–46.6)
HEMOGLOBIN: 12.1 g/dL (ref 11.1–15.9)
MCH: 29.8 pg (ref 26.6–33.0)
MCHC: 34.5 g/dL (ref 31.5–35.7)
MCV: 87 fL (ref 79–97)
Platelets: 220 10*3/uL (ref 150–450)
RBC: 4.06 x10E6/uL (ref 3.77–5.28)
RDW: 14 % (ref 12.3–15.4)
WBC: 8.4 10*3/uL (ref 3.4–10.8)

## 2017-10-29 LAB — RPR: RPR Ser Ql: NONREACTIVE

## 2017-10-29 LAB — GLUCOSE, 1 HOUR GESTATIONAL: Gestational Diabetes Screen: 120 mg/dL (ref 65–139)

## 2017-11-14 ENCOUNTER — Encounter: Payer: Self-pay | Admitting: Obstetrics and Gynecology

## 2017-11-14 ENCOUNTER — Other Ambulatory Visit: Payer: Self-pay

## 2017-11-14 ENCOUNTER — Ambulatory Visit (INDEPENDENT_AMBULATORY_CARE_PROVIDER_SITE_OTHER): Payer: Medicaid Other | Admitting: Obstetrics and Gynecology

## 2017-11-14 VITALS — BP 116/70 | HR 79 | Wt 193.0 lb

## 2017-11-14 DIAGNOSIS — O0993 Supervision of high risk pregnancy, unspecified, third trimester: Secondary | ICD-10-CM

## 2017-11-14 DIAGNOSIS — O3433 Maternal care for cervical incompetence, third trimester: Secondary | ICD-10-CM

## 2017-11-14 LAB — POCT URINALYSIS DIPSTICK
Bilirubin, UA: NEGATIVE
Blood, UA: NEGATIVE
Glucose, UA: NEGATIVE
Ketones, UA: NEGATIVE
NITRITE UA: NEGATIVE
PH UA: 7.5 (ref 5.0–8.0)
PROTEIN UA: NEGATIVE
Spec Grav, UA: 1.01 (ref 1.010–1.025)
UROBILINOGEN UA: 0.2 U/dL

## 2017-11-14 MED ORDER — CONCEPT DHA 53.5-38-1 MG PO CAPS: 1.0000 | ORAL_CAPSULE | Freq: Every day | ORAL | 6 refills | Status: DC

## 2017-11-14 NOTE — Progress Notes (Signed)
Pt states no concerns at this time 

## 2017-11-14 NOTE — Progress Notes (Signed)
ROB: Patient with no complaints.  Cerclage removal at 37/38 weeks discussed in detail.  Procedure for cerclage removal reviewed.

## 2017-12-05 ENCOUNTER — Ambulatory Visit (INDEPENDENT_AMBULATORY_CARE_PROVIDER_SITE_OTHER): Payer: BLUE CROSS/BLUE SHIELD | Admitting: Obstetrics and Gynecology

## 2017-12-05 VITALS — BP 106/70 | HR 82 | Wt 198.9 lb

## 2017-12-05 DIAGNOSIS — O0993 Supervision of high risk pregnancy, unspecified, third trimester: Secondary | ICD-10-CM

## 2017-12-05 DIAGNOSIS — O3433 Maternal care for cervical incompetence, third trimester: Secondary | ICD-10-CM

## 2017-12-05 LAB — POCT URINALYSIS DIPSTICK OB
BILIRUBIN UA: NEGATIVE
Blood, UA: NEGATIVE
Glucose, UA: NEGATIVE — AB
KETONES UA: NEGATIVE
NITRITE UA: NEGATIVE
PH UA: 7.5 (ref 5.0–8.0)
PROTEIN: NEGATIVE
SPEC GRAV UA: 1.01 (ref 1.010–1.025)
UROBILINOGEN UA: 0.2 U/dL

## 2017-12-05 NOTE — Patient Instructions (Signed)
[facility]  [facility]  [address], [place], [place] [address]  Phone: [phone]  [facility] (second location)  [address] [place], [place] [address]  Phone: [phone]  [facility] ([place]) [address], [place], [place] [address] Phone: [phone]  [facility]  [address] [place], [place] [address]  Phone: [phone] 

## 2017-12-05 NOTE — Progress Notes (Signed)
ROB: Doing well, no complaints. Declines female circumcision.  RTC in 2 weeks. For 3rd trimester cultures at that time.

## 2017-12-05 NOTE — Progress Notes (Signed)
ROB- PT stated that she is doing well. No complaints.   

## 2017-12-20 ENCOUNTER — Encounter: Payer: BLUE CROSS/BLUE SHIELD | Admitting: Obstetrics and Gynecology

## 2017-12-20 ENCOUNTER — Ambulatory Visit (INDEPENDENT_AMBULATORY_CARE_PROVIDER_SITE_OTHER): Payer: BLUE CROSS/BLUE SHIELD | Admitting: Certified Nurse Midwife

## 2017-12-20 VITALS — BP 114/66 | HR 90 | Wt 200.9 lb

## 2017-12-20 DIAGNOSIS — Z23 Encounter for immunization: Secondary | ICD-10-CM

## 2017-12-20 DIAGNOSIS — Z3493 Encounter for supervision of normal pregnancy, unspecified, third trimester: Secondary | ICD-10-CM

## 2017-12-20 DIAGNOSIS — Z113 Encounter for screening for infections with a predominantly sexual mode of transmission: Secondary | ICD-10-CM

## 2017-12-20 DIAGNOSIS — Z3685 Encounter for antenatal screening for Streptococcus B: Secondary | ICD-10-CM

## 2017-12-20 LAB — POCT URINALYSIS DIPSTICK OB
Bilirubin, UA: NEGATIVE
Blood, UA: NEGATIVE
Glucose, UA: NEGATIVE
KETONES UA: NEGATIVE
Leukocytes, UA: NEGATIVE
NITRITE UA: NEGATIVE
PH UA: 6 (ref 5.0–8.0)
PROTEIN: NEGATIVE
Spec Grav, UA: 1.01 (ref 1.010–1.025)
UROBILINOGEN UA: 0.2 U/dL

## 2017-12-20 NOTE — Progress Notes (Signed)
ROB- cultures obtained, pt is having pelvic pressure 

## 2017-12-20 NOTE — Patient Instructions (Addendum)
Vaginal Delivery Vaginal delivery means that you will give birth by pushing your baby out of your birth canal (vagina). A team of health care providers will help you before, during, and after vaginal delivery. Birth experiences are unique for every woman and every pregnancy, and birth experiences vary depending on where you choose to give birth. What should I do to prepare for my baby's birth? Before your baby is born, it is important to talk with your health care provider about:  Your labor and delivery preferences. These may include: ? Medicines that you may be given. ? How you will manage your pain. This might include non-medical pain relief techniques or injectable pain relief such as epidural analgesia. ? How you and your baby will be monitored during labor and delivery. ? Who may be in the labor and delivery room with you. ? Your feelings about surgical delivery of your baby (cesarean delivery, or C-section) if this becomes necessary. ? Your feelings about receiving donated blood through an IV tube (blood transfusion) if this becomes necessary.  Whether you are able: ? To take pictures or videos of the birth. ? To eat during labor and delivery. ? To move around, walk, or change positions during labor and delivery.  What to expect after your baby is born, such as: ? Whether delayed umbilical cord clamping and cutting is offered. ? Who will care for your baby right after birth. ? Medicines or tests that may be recommended for your baby. ? Whether breastfeeding is supported in your hospital or birth center. ? How long you will be in the hospital or birth center.  How any medical conditions you have may affect your baby or your labor and delivery experience.  To prepare for your baby's birth, you should also:  Attend all of your health care visits before delivery (prenatal visits) as recommended by your health care provider. This is important.  Prepare your home for your baby's  arrival. Make sure that you have: ? Diapers. ? Baby clothing. ? Feeding equipment. ? Safe sleeping arrangements for you and your baby.  Install a car seat in your vehicle. Have your car seat checked by a certified car seat installer to make sure that it is installed safely.  Think about who will help you with your new baby at home for at least the first several weeks after delivery.  What can I expect when I arrive at the birth center or hospital? Once you are in labor and have been admitted into the hospital or birth center, your health care provider may:  Review your pregnancy history and any concerns you have.  Insert an IV tube into one of your veins. This is used to give you fluids and medicines.  Check your blood pressure, pulse, temperature, and heart rate (vital signs).  Check whether your bag of water (amniotic sac) has broken (ruptured).  Talk with you about your birth plan and discuss pain control options.  Monitoring Your health care provider may monitor your contractions (uterine monitoring) and your baby's heart rate (fetal monitoring). You may need to be monitored:  Often, but not continuously (intermittently).  All the time or for long periods at a time (continuously). Continuous monitoring may be needed if: ? You are taking certain medicines, such as medicine to relieve pain or make your contractions stronger. ? You have pregnancy or labor complications.  Monitoring may be done by:  Placing a special stethoscope or a handheld monitoring device on your abdomen to   check your baby's heartbeat, and feeling your abdomen for contractions. This method of monitoring does not continuously record your baby's heartbeat or your contractions.  Placing monitors on your abdomen (external monitors) to record your baby's heartbeat and the frequency and length of contractions. You may not have to wear external monitors all the time.  Placing monitors inside of your uterus  (internal monitors) to record your baby's heartbeat and the frequency, length, and strength of your contractions. ? Your health care provider may use internal monitors if he or she needs more information about the strength of your contractions or your baby's heart rate. ? Internal monitors are put in place by passing a thin, flexible wire through your vagina and into your uterus. Depending on the type of monitor, it may remain in your uterus or on your baby's head until birth. ? Your health care provider will discuss the benefits and risks of internal monitoring with you and will ask for your permission before inserting the monitors.  Telemetry. This is a type of continuous monitoring that can be done with external or internal monitors. Instead of having to stay in bed, you are able to move around during telemetry. Ask your health care provider if telemetry is an option for you.  Physical exam Your health care provider may perform a physical exam. This may include:  Checking whether your baby is positioned: ? With the head toward your vagina (head-down). This is most common. ? With the head toward the top of your uterus (head-up or breech). If your baby is in a breech position, your health care provider may try to turn your baby to a head-down position so you can deliver vaginally. If it does not seem that your baby can be born vaginally, your provider may recommend surgery to deliver your baby. In rare cases, you may be able to deliver vaginally if your baby is head-up (breech delivery). ? Lying sideways (transverse). Babies that are lying sideways cannot be delivered vaginally.  Checking your cervix to determine: ? Whether it is thinning out (effacing). ? Whether it is opening up (dilating). ? How low your baby has moved into your birth canal.  What are the three stages of labor and delivery?  Normal labor and delivery is divided into the following three stages: Stage 1  Stage 1 is the  longest stage of labor, and it can last for hours or days. Stage 1 includes: ? Early labor. This is when contractions may be irregular, or regular and mild. Generally, early labor contractions are more than 10 minutes apart. ? Active labor. This is when contractions get longer, more regular, more frequent, and more intense. ? The transition phase. This is when contractions happen very close together, are very intense, and may last longer than during any other part of labor.  Contractions generally feel mild, infrequent, and irregular at first. They get stronger, more frequent (about every 2-3 minutes), and more regular as you progress from early labor through active labor and transition.  Many women progress through stage 1 naturally, but you may need help to continue making progress. If this happens, your health care provider may talk with you about: ? Rupturing your amniotic sac if it has not ruptured yet. ? Giving you medicine to help make your contractions stronger and more frequent.  Stage 1 ends when your cervix is completely dilated to 4 inches (10 cm) and completely effaced. This happens at the end of the transition phase. Stage 2  Once   your cervix is completely effaced and dilated to 4 inches (10 cm), you may start to feel an urge to push. It is common for the body to naturally take a rest before feeling the urge to push, especially if you received an epidural or certain other pain medicines. This rest period may last for up to 1-2 hours, depending on your unique labor experience.  During stage 2, contractions are generally less painful, because pushing helps relieve contraction pain. Instead of contraction pain, you may feel stretching and burning pain, especially when the widest part of your baby's head passes through the vaginal opening (crowning).  Your health care provider will closely monitor your pushing progress and your baby's progress through the vagina during stage 2.  Your  health care provider may massage the area of skin between your vaginal opening and anus (perineum) or apply warm compresses to your perineum. This helps it stretch as the baby's head starts to crown, which can help prevent perineal tearing. ? In some cases, an incision may be made in your perineum (episiotomy) to allow the baby to pass through the vaginal opening. An episiotomy helps to make the opening of the vagina larger to allow more room for the baby to fit through.  It is very important to breathe and focus so your health care provider can control the delivery of your baby's head. Your health care provider may have you decrease the intensity of your pushing, to help prevent perineal tearing.  After delivery of your baby's head, the shoulders and the rest of the body generally deliver very quickly and without difficulty.  Once your baby is delivered, the umbilical cord may be cut right away, or this may be delayed for 1-2 minutes, depending on your baby's health. This may vary among health care providers, hospitals, and birth centers.  If you and your baby are healthy enough, your baby may be placed on your chest or abdomen to help maintain the baby's temperature and to help you bond with each other. Some mothers and babies start breastfeeding at this time. Your health care team will dry your baby and help keep your baby warm during this time.  Your baby may need immediate care if he or she: ? Showed signs of distress during labor. ? Has a medical condition. ? Was born too early (prematurely). ? Had a bowel movement before birth (meconium). ? Shows signs of difficulty transitioning from being inside the uterus to being outside of the uterus. If you are planning to breastfeed, your health care team will help you begin a feeding. Stage 3  The third stage of labor starts immediately after the birth of your baby and ends after you deliver the placenta. The placenta is an organ that develops  during pregnancy to provide oxygen and nutrients to your baby in the womb.  Delivering the placenta may require some pushing, and you may have mild contractions. Breastfeeding can stimulate contractions to help you deliver the placenta.  After the placenta is delivered, your uterus should tighten (contract) and become firm. This helps to stop bleeding in your uterus. To help your uterus contract and to control bleeding, your health care provider may: ? Give you medicine by injection, through an IV tube, by mouth, or through your rectum (rectally). ? Massage your abdomen or perform a vaginal exam to remove any blood clots that are left in your uterus. ? Empty your bladder by placing a thin, flexible tube (catheter) into your bladder. ? Encourage   you to breastfeed your baby. After labor is over, you and your baby will be monitored closely to ensure that you are both healthy until you are ready to go home. Your health care team will teach you how to care for yourself and your baby. This information is not intended to replace advice given to you by your health care provider. Make sure you discuss any questions you have with your health care provider. Document Released: 01/10/2008 Document Revised: 10/21/2015 Document Reviewed: 04/17/2015 Elsevier Interactive Patient Education  2018 [employer]. Skin Conditions During Pregnancy Pregnancy affects many parts of your body. One part is your skin. Most skin problems that develop during pregnancy are not serious and are considered a normal part of pregnancy. They go away on their own after the baby is born. Other skin problems may need treatment. What type of skin problems can develop during pregnancy?  Stretch marks. Stretch marks are purple or pink lines on the skin. They may appear on the belly, breasts, thighs, or buttocks. Stretch marks are caused by weight gain that causes the skin to stretch. Stretch marks do not cause problems. Almost all women get  them during pregnancy.  Darkening of the skin (hyperpigmentation). The darkening may occur in patches or as a line. Patches may appear on the face, nipples, or genital area. Lines often stretch from the belly button to the pubic area. Hyperpigmentation develops in almost all pregnant women. It is more severe in women with a dark complexion.  Spider angiomas. These are tiny pink or red lines that go out from a center point, like the legs of a spider. Usually, they are on the face, neck, and arms. They do not cause problems. They are most common in women with light complexions.  Palmar erythema. This is a reddening of the palms. It is most common in women with light complexions.  Swelling and redness. This can occur on the face, eyelids, fingers, or toes.  Pruritic urticarial papules and plaques of pregnancy (PUPPP). This is a rash that is itchy, red, and has tiny blisters. The cause is unknown. It usually starts on the abdomen and may affect the arms or legs. It does not affect the face. It usually begins later in pregnancy. About a third of all pregnant women develop this condition. There are no associated problems to the fetus with this rash. Sometimes, oral steroids are used to calm down the itch. The rash clears after the baby is born.  Prurigo of pregnancy. This is a disease in which red patches and bumps appear on the arms and legs. The cause is unknown. The patches and bumps clear after the baby is born. About a third of pregnant women develop this disease.  Acne. Pimples may develop, including in women who have had clear skin for a long time.  Skin tags. These are small flaps of skin that stick out from the body. They may grow or become darker during pregnancy. They are usually harmless.  Moles. These are flat or slightly raised growths. They are usually round and pink or brown. They may grow or become darker during pregnancy.  Intrahepatic cholestasis of pregnancy. This is a rare  condition that causes itchy skin. It may run in families. It increases the risk of complications for the fetus. This condition usually resolves after delivery. It can recur with subsequent pregnancies.  Impetigo herpetiformis. This is a form of a severe skin disease called pustular psoriasis. Usually, delivery is the only method of resolving  the condition.  Pruritic folliculitis of pregnancy. This is a rare condition that causes pimple-like skin growths. It develops in the middle or later stages of pregnancy. Its cause is unknown.It usually resolves 2-3 weeks after delivery.  Pemphigoid gestationis. This is a very rare autoimmune disease. It causes a severely itchy rash and blisters. The rash does not appear on the face, scalp, or inside of the mouth. It usually resolves 3 months after delivery. It may recur with subsequent pregnancies. Some pre-existing skin conditions, such as atopic dermatitis, may become worse during pregnancy. Follow these instructions at home: Different conditions may have different instructions. In general:  Follow all your health care provider's directions about medicines to treat skin problems while you are pregnant. Do not use any over-the-counter medicines (including medicated creams and lotions) until you have checked with your health care provider. Many medicines are not safe to use when you are pregnant.  Avoid time in the sun. This will help keep your skin from darkening. When you must be outside, use sunscreen and wear a hat with a wide brim to protect your face. The sunscreen should have a SPF of at least 15. This may help limit dark spots that develop when the skin is exposed to the sun.  To avoid problems from stretched skin: ? Do not sit or stand for long periods of time. ? Exercise regularly. This helps keep your skin in good condition.  Use a gentle soap. This helps prevent acne.  Do not get too hot or too sweaty. This makes some skin rashes worse.  Wear  loose clothes made of a soft fabric. This prevents skin irritation.  For itching, add oatmeal or cornstarch to your bathwater.  Use a skin moisturizer. Ask your health care provider for suggestions.  This information is not intended to replace advice given to you by your health care provider. Make sure you discuss any questions you have with your health care provider. Document Released: 05/05/2010 Document Revised: 09/08/2015 Document Reviewed: 01/12/2013 Elsevier Interactive Patient Education  [employer].

## 2017-12-22 LAB — GC/CHLAMYDIA PROBE AMP
Chlamydia trachomatis, NAA: NEGATIVE
NEISSERIA GONORRHOEAE BY PCR: NEGATIVE

## 2017-12-22 LAB — STREP GP B NAA: Strep Gp B NAA: NEGATIVE

## 2017-12-22 NOTE — Progress Notes (Signed)
Please contact. 36 week cultures negative. Encourage to activate MyChart. Thanks, JML

## 2017-12-22 NOTE — Progress Notes (Signed)
ROB-Doing well, flu vaccine given. 36 week cultures collected. SVE deferred due to cerclage. Anticipatory guidance regarding course of prenatal care. Desires NCB. [facility] [employer] given. Reviewed red flag symptoms and when to call. RTC x 1 week for ROB with cerclage removal or sooner if needed.

## 2017-12-24 ENCOUNTER — Telehealth: Payer: Self-pay | Admitting: *Deleted

## 2017-12-24 NOTE — Telephone Encounter (Signed)
Pt notified of labs

## 2017-12-27 ENCOUNTER — Ambulatory Visit (INDEPENDENT_AMBULATORY_CARE_PROVIDER_SITE_OTHER): Payer: BLUE CROSS/BLUE SHIELD | Admitting: Obstetrics and Gynecology

## 2017-12-27 VITALS — BP 134/81 | HR 97 | Wt 204.2 lb

## 2017-12-27 DIAGNOSIS — N760 Acute vaginitis: Secondary | ICD-10-CM | POA: Diagnosis not present

## 2017-12-27 DIAGNOSIS — B3731 Acute candidiasis of vulva and vagina: Secondary | ICD-10-CM

## 2017-12-27 DIAGNOSIS — B373 Candidiasis of vulva and vagina: Secondary | ICD-10-CM | POA: Diagnosis not present

## 2017-12-27 DIAGNOSIS — Z3A37 37 weeks gestation of pregnancy: Secondary | ICD-10-CM

## 2017-12-27 DIAGNOSIS — O0993 Supervision of high risk pregnancy, unspecified, third trimester: Secondary | ICD-10-CM

## 2017-12-27 DIAGNOSIS — O3433 Maternal care for cervical incompetence, third trimester: Secondary | ICD-10-CM | POA: Diagnosis not present

## 2017-12-27 DIAGNOSIS — B9689 Other specified bacterial agents as the cause of diseases classified elsewhere: Secondary | ICD-10-CM | POA: Diagnosis not present

## 2017-12-27 DIAGNOSIS — O2686 Pruritic urticarial papules and plaques of pregnancy (PUPPP): Secondary | ICD-10-CM

## 2017-12-27 LAB — POCT URINALYSIS DIPSTICK OB
BILIRUBIN UA: NEGATIVE
GLUCOSE, UA: NEGATIVE
Ketones, UA: NEGATIVE
Nitrite, UA: NEGATIVE
PH UA: 7 (ref 5.0–8.0)
POC,PROTEIN,UA: NEGATIVE
RBC UA: NEGATIVE
SPEC GRAV UA: 1.01 (ref 1.010–1.025)
Urobilinogen, UA: 0.2 E.U./dL

## 2017-12-27 NOTE — Progress Notes (Deleted)
Error

## 2017-12-27 NOTE — Progress Notes (Signed)
   ROB-PT stated that she is doing well no complaints.    

## 2017-12-27 NOTE — Progress Notes (Signed)
ROB: Overall doing well.  Cultures reviewed, negative.  For cerclage removal today, done.  Thick white/green discharge noted on exam. Wet prep with clue cells and numerous hyphae. Will treat with Flagyl and Diflucan. Given labor and bleeding precautions. Also with what appears to be PUPP rash on abdomen, with papules noted along stretch marks. Patient notes itching, usually worse at night. Has been using Aloe Vera and Aveeno. Recommend OTC hydrocortisone cream and Benadryl for night-time symptoms. RTC in 1 week.

## 2017-12-30 ENCOUNTER — Telehealth: Payer: Self-pay | Admitting: Obstetrics and Gynecology

## 2017-12-30 NOTE — Telephone Encounter (Signed)
[facility] and I needed to speak with her to see how many mg and how long she needed to take the medication. Pt stated that she understood. [facility]

## 2017-12-30 NOTE — Telephone Encounter (Signed)
The patient called and stated that she had an apt on 12/27/17 and her two prescriptions were never sent into her pharmacy. The patient stated that she was informed that those medications would be ready for pick up last week. The patient is wanting a call back to today to resolve the medication issue. Please advise.

## 2017-12-31 ENCOUNTER — Other Ambulatory Visit: Payer: Self-pay

## 2017-12-31 MED ORDER — FLUCONAZOLE 150 MG PO TABS: 150.0000 mg | ORAL_TABLET | Freq: Once | ORAL | 1 refills | Status: AC

## 2017-12-31 MED ORDER — METRONIDAZOLE 500 MG PO TABS: 500.0000 mg | ORAL_TABLET | Freq: Two times a day (BID) | ORAL | 0 refills | Status: DC

## 2017-12-31 NOTE — Telephone Encounter (Signed)
Pt called and informed her that medication had been sent to the her pharmacy.

## 2018-01-03 ENCOUNTER — Ambulatory Visit (INDEPENDENT_AMBULATORY_CARE_PROVIDER_SITE_OTHER): Payer: BLUE CROSS/BLUE SHIELD | Admitting: Obstetrics and Gynecology

## 2018-01-03 VITALS — BP 118/79 | HR 90 | Wt 202.8 lb

## 2018-01-03 DIAGNOSIS — Z3403 Encounter for supervision of normal first pregnancy, third trimester: Secondary | ICD-10-CM

## 2018-01-03 DIAGNOSIS — O09293 Supervision of pregnancy with other poor reproductive or obstetric history, third trimester: Secondary | ICD-10-CM

## 2018-01-03 DIAGNOSIS — O09299 Supervision of pregnancy with other poor reproductive or obstetric history, unspecified trimester: Secondary | ICD-10-CM

## 2018-01-03 DIAGNOSIS — O2686 Pruritic urticarial papules and plaques of pregnancy (PUPPP): Secondary | ICD-10-CM

## 2018-01-03 LAB — POCT URINALYSIS DIPSTICK OB
BILIRUBIN UA: NEGATIVE
Blood, UA: NEGATIVE
Glucose, UA: NEGATIVE
Ketones, UA: NEGATIVE
NITRITE UA: NEGATIVE
PROTEIN: NEGATIVE
Spec Grav, UA: 1.01 (ref 1.010–1.025)
UROBILINOGEN UA: 0.2 U/dL
pH, UA: 7 (ref 5.0–8.0)

## 2018-01-03 MED ORDER — HYDROCORTISONE 0.5 % EX CREA: 1.0000 "application " | TOPICAL_CREAM | Freq: Two times a day (BID) | CUTANEOUS | 0 refills | Status: DC

## 2018-01-03 NOTE — Progress Notes (Signed)
ROB: Doing well. Notes vaginal pressure but no contractions. Exam unchanged, small amount of suture material palpable at edge of cervix near 11:00 but is superficial. Given labor precautions. Will prescribe hydrocortisone cream for PUPPS.  Also encouraged use of Benadryl for itching. Is taking medication for recent yeast and BV infections. RTC in 1 week.

## 2018-01-03 NOTE — Progress Notes (Signed)
   ROB-PT stated that she is having some pressure in the vaginal area no other complaints overall pt stated that she is doing well.

## 2018-01-07 ENCOUNTER — Encounter: Payer: Self-pay | Admitting: *Deleted

## 2018-01-07 ENCOUNTER — Other Ambulatory Visit: Payer: Self-pay

## 2018-01-07 ENCOUNTER — Inpatient Hospital Stay
Admission: EM | Admit: 2018-01-07 | Discharge: 2018-01-09 | DRG: 807 | Disposition: A | Discharge status: Home / Self Care | Payer: BLUE CROSS/BLUE SHIELD | Attending: Obstetrics and Gynecology | Admitting: Obstetrics and Gynecology

## 2018-01-07 DIAGNOSIS — O9081 Anemia of the puerperium: Secondary | ICD-10-CM | POA: Diagnosis not present

## 2018-01-07 DIAGNOSIS — E669 Obesity, unspecified: Secondary | ICD-10-CM | POA: Diagnosis present

## 2018-01-07 DIAGNOSIS — Z6830 Body mass index (BMI) 30.0-30.9, adult: Secondary | ICD-10-CM

## 2018-01-07 DIAGNOSIS — Z3A39 39 weeks gestation of pregnancy: Secondary | ICD-10-CM

## 2018-01-07 DIAGNOSIS — O3432 Maternal care for cervical incompetence, second trimester: Secondary | ICD-10-CM

## 2018-01-07 DIAGNOSIS — A749 Chlamydial infection, unspecified: Secondary | ICD-10-CM

## 2018-01-07 DIAGNOSIS — O99214 Obesity complicating childbirth: Secondary | ICD-10-CM | POA: Diagnosis present

## 2018-01-07 DIAGNOSIS — O3433 Maternal care for cervical incompetence, third trimester: Secondary | ICD-10-CM | POA: Diagnosis present

## 2018-01-07 DIAGNOSIS — O3430 Maternal care for cervical incompetence, unspecified trimester: Secondary | ICD-10-CM

## 2018-01-07 DIAGNOSIS — O98812 Other maternal infectious and parasitic diseases complicating pregnancy, second trimester: Secondary | ICD-10-CM

## 2018-01-07 DIAGNOSIS — Z3483 Encounter for supervision of other normal pregnancy, third trimester: Secondary | ICD-10-CM | POA: Diagnosis present

## 2018-01-07 DIAGNOSIS — O0990 Supervision of high risk pregnancy, unspecified, unspecified trimester: Secondary | ICD-10-CM

## 2018-01-07 DIAGNOSIS — Z3490 Encounter for supervision of normal pregnancy, unspecified, unspecified trimester: Secondary | ICD-10-CM

## 2018-01-07 DIAGNOSIS — O99210 Obesity complicating pregnancy, unspecified trimester: Secondary | ICD-10-CM

## 2018-01-07 DIAGNOSIS — O2342 Unspecified infection of urinary tract in pregnancy, second trimester: Secondary | ICD-10-CM

## 2018-01-07 LAB — TYPE AND SCREEN
ABO/RH(D): A POS
ANTIBODY SCREEN: NEGATIVE

## 2018-01-07 LAB — CBC
HCT: 34.9 % — ABNORMAL LOW (ref 35.0–47.0)
HEMOGLOBIN: 11.9 g/dL — AB (ref 12.0–16.0)
MCH: 27.9 pg (ref 26.0–34.0)
MCHC: 34 g/dL (ref 32.0–36.0)
MCV: 82.1 fL (ref 80.0–100.0)
PLATELETS: 239 10*3/uL (ref 150–440)
RBC: 4.25 MIL/uL (ref 3.80–5.20)
RDW: 15.1 % — ABNORMAL HIGH (ref 11.5–14.5)
WBC: 15.6 10*3/uL — ABNORMAL HIGH (ref 3.6–11.0)

## 2018-01-07 LAB — RAPID HIV SCREEN (HIV 1/2 AB+AG)
HIV 1/2 ANTIBODIES: NONREACTIVE
HIV-1 P24 Antigen - HIV24: NONREACTIVE

## 2018-01-07 MED ORDER — OXYTOCIN 40 UNITS IN LACTATED RINGERS INFUSION - SIMPLE MED
2.5000 [IU]/h | INTRAVENOUS | Status: DC
  Administered 2018-01-07: 2.5 [IU]/h via INTRAVENOUS
  Filled 2018-01-07: qty 1000

## 2018-01-07 MED ORDER — OXYCODONE-ACETAMINOPHEN 5-325 MG PO TABS: 1.0000 | ORAL_TABLET | ORAL | Status: DC | PRN

## 2018-01-07 MED ORDER — MISOPROSTOL 200 MCG PO TABS
ORAL_TABLET | ORAL | Status: AC
  Filled 2018-01-07: qty 4

## 2018-01-07 MED ORDER — PROMETHAZINE HCL 25 MG/ML IJ SOLN
25.0000 mg | Freq: Once | INTRAMUSCULAR | Status: AC
  Administered 2018-01-07: 25 mg via INTRAMUSCULAR

## 2018-01-07 MED ORDER — ACETAMINOPHEN 325 MG PO TABS: 650.0000 mg | ORAL_TABLET | ORAL | Status: DC | PRN

## 2018-01-07 MED ORDER — DIBUCAINE 1 % RE OINT: 1.0000 "application " | TOPICAL_OINTMENT | RECTAL | Status: DC | PRN

## 2018-01-07 MED ORDER — ZOLPIDEM TARTRATE 5 MG PO TABS: 5.0000 mg | ORAL_TABLET | Freq: Every evening | ORAL | Status: DC | PRN

## 2018-01-07 MED ORDER — PRENATAL MULTIVITAMIN CH
1.0000 | ORAL_TABLET | Freq: Every day | ORAL | Status: DC
  Administered 2018-01-08 – 2018-01-09 (×2): 1 via ORAL
  Filled 2018-01-07 (×2): qty 1

## 2018-01-07 MED ORDER — COCONUT OIL OIL: 1.0000 "application " | TOPICAL_OIL | Status: DC | PRN

## 2018-01-07 MED ORDER — SIMETHICONE 80 MG PO CHEW: 80.0000 mg | CHEWABLE_TABLET | ORAL | Status: DC | PRN

## 2018-01-07 MED ORDER — MORPHINE SULFATE (PF) 10 MG/ML IV SOLN
INTRAVENOUS | Status: AC
  Administered 2018-01-07: 10 mg via INTRAMUSCULAR
  Filled 2018-01-07: qty 1

## 2018-01-07 MED ORDER — MORPHINE SULFATE (PF) 10 MG/ML IV SOLN
10.0000 mg | Freq: Once | INTRAVENOUS | Status: AC
  Administered 2018-01-07: 10 mg via INTRAMUSCULAR

## 2018-01-07 MED ORDER — BUTORPHANOL TARTRATE 1 MG/ML IJ SOLN
1.0000 mg | INTRAMUSCULAR | Status: DC | PRN
  Administered 2018-01-07: 1 mg via INTRAVENOUS
  Filled 2018-01-07: qty 1

## 2018-01-07 MED ORDER — IBUPROFEN 800 MG PO TABS
800.0000 mg | ORAL_TABLET | Freq: Three times a day (TID) | ORAL | Status: DC
  Administered 2018-01-07 – 2018-01-09 (×6): 800 mg via ORAL
  Filled 2018-01-07 (×6): qty 1

## 2018-01-07 MED ORDER — FENTANYL CITRATE (PF) 100 MCG/2ML IJ SOLN
100.0000 ug | Freq: Once | INTRAMUSCULAR | Status: AC
  Administered 2018-01-07: 100 ug via INTRAVENOUS
  Filled 2018-01-07: qty 2

## 2018-01-07 MED ORDER — SOD CITRATE-CITRIC ACID 500-334 MG/5ML PO SOLN: 30.0000 mL | ORAL | Status: DC | PRN

## 2018-01-07 MED ORDER — WITCH HAZEL-GLYCERIN EX PADS: 1.0000 "application " | MEDICATED_PAD | CUTANEOUS | Status: DC | PRN

## 2018-01-07 MED ORDER — ONDANSETRON HCL 4 MG/2ML IJ SOLN: 4.0000 mg | Freq: Four times a day (QID) | INTRAMUSCULAR | Status: DC | PRN

## 2018-01-07 MED ORDER — OXYTOCIN 10 UNIT/ML IJ SOLN
INTRAMUSCULAR | Status: AC
  Filled 2018-01-07: qty 2

## 2018-01-07 MED ORDER — LIDOCAINE HCL (PF) 1 % IJ SOLN: 30.0000 mL | INTRAMUSCULAR | Status: DC | PRN

## 2018-01-07 MED ORDER — SENNOSIDES-DOCUSATE SODIUM 8.6-50 MG PO TABS
2.0000 | ORAL_TABLET | ORAL | Status: DC
  Administered 2018-01-08 – 2018-01-09 (×2): 2 via ORAL
  Filled 2018-01-07 (×2): qty 2

## 2018-01-07 MED ORDER — BENZOCAINE-MENTHOL 20-0.5 % EX AERO: 1.0000 "application " | INHALATION_SPRAY | CUTANEOUS | Status: DC | PRN

## 2018-01-07 MED ORDER — AMMONIA AROMATIC IN INHA
RESPIRATORY_TRACT | Status: AC
  Filled 2018-01-07: qty 10

## 2018-01-07 MED ORDER — ONDANSETRON HCL 4 MG PO TABS: 4.0000 mg | ORAL_TABLET | ORAL | Status: DC | PRN

## 2018-01-07 MED ORDER — PROMETHAZINE HCL 25 MG/ML IJ SOLN
INTRAMUSCULAR | Status: AC
  Administered 2018-01-07: 25 mg via INTRAMUSCULAR
  Filled 2018-01-07: qty 1

## 2018-01-07 MED ORDER — LACTATED RINGERS IV SOLN: 500.0000 mL | INTRAVENOUS | Status: DC | PRN

## 2018-01-07 MED ORDER — LACTATED RINGERS IV SOLN
INTRAVENOUS | Status: DC
  Administered 2018-01-07 (×2): via INTRAVENOUS

## 2018-01-07 MED ORDER — DIPHENHYDRAMINE HCL 25 MG PO CAPS: 25.0000 mg | ORAL_CAPSULE | Freq: Four times a day (QID) | ORAL | Status: DC | PRN

## 2018-01-07 MED ORDER — ONDANSETRON HCL 4 MG/2ML IJ SOLN: 4.0000 mg | INTRAMUSCULAR | Status: DC | PRN

## 2018-01-07 MED ORDER — OXYCODONE-ACETAMINOPHEN 5-325 MG PO TABS: 2.0000 | ORAL_TABLET | ORAL | Status: DC | PRN

## 2018-01-07 MED ORDER — OXYTOCIN BOLUS FROM INFUSION
500.0000 mL | Freq: Once | INTRAVENOUS | Status: AC
  Administered 2018-01-07: 500 mL via INTRAVENOUS

## 2018-01-07 MED ORDER — LIDOCAINE HCL (PF) 1 % IJ SOLN
INTRAMUSCULAR | Status: AC
  Filled 2018-01-07: qty 30

## 2018-01-07 NOTE — Progress Notes (Signed)
Intrapartum Progress Note  S: Patient complains of pain with contractions.   O: Blood pressure 138/83, pulse 99, temperature 98.6 F (37 C), temperature source Oral, resp. rate 16, height 5\' 2"  (1.575 m), weight 91.2 kg, last menstrual period 04/08/2017. Gen App: NAD, comfortable Abdomen: soft, gravid FHT: baseline 150 bpm.  Accels present.  Decels absent. moderate in degree variability.   Tocometer: contractions q 2-3 minutes Cervix: 7/90/+1, AROM Extremities: Nontender, no edema.  Labs: No new labs   Assessment:  1: SIUP at [id number] 2. History of cervical incompetence with cerclage this pregnancy 3. GBS negative  Plan:  1. Continue expectant management. Pain as desired with IV pain meds. Currently declines epidural.  2. Anticipates vaginal delivery.    Hildred Laserherry, Darcell Sabino, MD 01/07/2018 12:43 PM

## 2018-01-07 NOTE — H&P (Signed)
Obstetric History and Physical  Cassie Garza is a 27 y.o. G1P0 with IUP at [redacted]w[redacted]d with h/o cervical incompetence with cerclage placed this pregnancy at 16 weeks for cervical incompetency presenting for complaints of contractions since ~ 5 pm yesterday, progressively getting stronger and more regular overnight. Patient states she has been having  regular, every 3-5 minutes contractions, scant vaginal bleeding, intact membranes, with active fetal movement.    Prenatal Course Source of Care: [facility] with onset of care at 12 weeks (transfer of care to [facility] at [redacted] weeks gestation).  Pregnancy complications or risks: Patient Active Problem List   Diagnosis Date Noted  . Pregnancy 01/07/2018  . Labor and delivery indication for care or intervention 01/07/2018  . Cervical cerclage suture present in second trimester 08/06/2017  . Vaginal bleeding in pregnancy, second trimester 07/29/2017  . Chronic idiopathic constipation 07/29/2017  . Obesity in pregnancy 07/15/2017  . BMI 30.0-30.9,adult 07/15/2017  . Chlamydia infection affecting pregnancy 07/15/2017  . UTI (urinary tract infection) during pregnancy 07/15/2017  . Supervision of high risk pregnancy, antepartum 07/08/2017   She plans to breastfeed She desires undecided method for postpartum contraception.   Prenatal labs and studies: ABO, Rh: --/--/A POS (04/15 1627) Antibody: NEG (04/15 1627) Rubella: 5.77 (04/01 1602) RPR: Non Reactive (07/15 1017)  HBsAg: Negative (04/01 1602)  HIV: Non Reactive (04/01 1602)  ZOX:WRUEAVWU (09/06 1240) 1 hr Glucola  normal Genetic screening declined Anatomy US normal    History reviewed. No pertinent past medical history.  Past Surgical History:  Procedure Laterality Date  . CERVICAL CERCLAGE N/A 07/29/2017   Procedure: CERCLAGE CERVICAL;  Surgeon: Natale Milch, MD;  Location: [facility] ORS;  Service: Gynecology;  Laterality: N/A;  . NO PAST SURGERIES      OB  History  Gravida Para Term Preterm AB Living  1            SAB TAB Ectopic Multiple Live Births               # Outcome Date GA Lbr Len/2nd Weight Sex Delivery Anes PTL Lv  1 Current             Social History   Socioeconomic History  . Marital status: Single    Spouse name: Not on file  . Number of children: 0  . Years of education: 40  . Highest education level: Not on file  Occupational History  . Occupation: [employer]    Comment: [employer]  Social Needs  . Financial resource strain: Not on file  . Food insecurity:    Worry: Not on file    Inability: Not on file  . Transportation needs:    Medical: Not on file    Non-medical: Not on file  Tobacco Use  . Smoking status: Never Smoker  . Smokeless tobacco: Never Used  Substance and Sexual Activity  . Alcohol use: Not Currently  . Drug use: Never  . Sexual activity: Yes    Birth control/protection: None  Lifestyle  . Physical activity:    Days per week: Not on file    Minutes per session: Not on file  . Stress: Not on file  Relationships  . Social connections:    Talks on phone: Not on file    Gets together: Not on file    Attends religious service: Not on file    Active member of club or organization: Not on file    Attends meetings of clubs or organizations:  Not on file    Relationship status: Not on file  Other Topics Concern  . Not on file  Social History Narrative  . Not on file    Family History  Problem Relation Age of Onset  . Cancer Brother 7       LEUKEMIA  . Diabetes Maternal Grandmother   . Hypertension Maternal Grandmother     Medications Prior to Admission  Medication Sig Dispense Refill Last Dose  . metroNIDAZOLE (FLAGYL) 500 MG tablet Take 1 tablet (500 mg total) by mouth 2 (two) times daily. 14 tablet 0 01/06/2018 at Unknown time  . Prenat-FeFum-FePo-FA-Omega 3 (CONCEPT DHA) 53.5-38-1 MG CAPS Take 1 tablet by mouth daily. 60 capsule 6 01/06/2018 at Unknown time  . hydrocortisone  cream 0.5 % Apply 1 application topically 2 (two) times daily. (Patient not taking: Reported on 01/07/2018) 30 g 0 Not Taking at Unknown time    No Known Allergies  Review of Systems: Negative except for what is mentioned in HPI.  Physical Exam: BP 138/83 (BP Location: Left Arm)   Pulse 99   Temp 98.6 F (37 C) (Oral)   Resp 16   Ht 5\' 2"  (1.575 m)   Wt 91.2 kg   LMP 04/08/2017   BMI 36.76 kg/m  CONSTITUTIONAL: Well-developed, well-nourished female in no acute distress.  HENT:  Normocephalic, atraumatic, External right and left ear normal. Oropharynx is clear and moist EYES: Conjunctivae and EOM are normal. Pupils are equal, round, and reactive to light. No scleral icterus.  NECK: Normal range of motion, supple, no masses SKIN: Skin is warm and dry. No rash noted. Not diaphoretic. No erythema. No pallor. NEUROLOGIC: Alert and oriented to person, place, and time. Normal reflexes, muscle tone coordination. No cranial nerve deficit noted. PSYCHIATRIC: Normal mood and affect. Normal behavior. Normal judgment and thought content. CARDIOVASCULAR: Normal heart rate noted, regular rhythm RESPIRATORY: Effort and breath sounds normal, no problems with respiration noted ABDOMEN: Soft, nontender, nondistended, gravid. MUSCULOSKELETAL: Normal range of motion. No edema and no tenderness. 2+ distal pulses.  Cervical Exam: Dilatation 5 cm   Effacement 70%   Station 0   Presentation: cephalic FHT:  Baseline rate 150 bpm   Variability moderate  Accelerations present   Decelerations none Contractions: Every 3-5 mins   Pertinent Labs/Studies:   Results for orders placed or performed during the hospital encounter of 01/07/18 (from the past 24 hour(s))  CBC     Status: Abnormal   Collection Time: 01/07/18 10:48 AM  Result Value Ref Range   WBC 15.6 (H) 3.6 - 11.0 K/uL   RBC 4.25 3.80 - 5.20 MIL/uL   Hemoglobin 11.9 (L) 12.0 - 16.0 g/dL   HCT 04.5 (L) 40.9 - 81.1 %   MCV 82.1 80.0 - 100.0 fL    MCH 27.9 26.0 - 34.0 pg   MCHC 34.0 32.0 - 36.0 g/dL   RDW 91.4 (H) 78.2 - 95.6 %   Platelets 239 150 - 440 K/uL  Type and screen [facility]     Status: None (Preliminary result)   Collection Time: 01/07/18 10:48 AM  Result Value Ref Range   ABO/RH(D) A POS    Antibody Screen PENDING    Sample Expiration      01/10/2018 Performed at [facility], [address] [place], [place] [address]   Rapid HIV screen (HIV 1/2 Ab+Ag) ([facility] Only)     Status: None   Collection Time: 01/07/18 10:48 AM  Result Value Ref  Range   HIV-1 P24 Antigen - HIV24 NON REACTIVE NON REACTIVE   HIV 1/2 Antibodies NON REACTIVE NON REACTIVE   Interpretation (HIV Ag Ab)      A non reactive test result means that HIV 1 or HIV 2 antibodies and HIV 1 p24 antigen were not detected in the specimen.    Assessment : Cassie Garza is a 27 y.o. G1P0 at 6451w1d being admitted for labor.  H/o cervical incompetency s/p cervical cerclage placed this pregnancy (16 weeks).  H/o chlamydia this pregnancy, treated.   Plan: Labor: Expectant management. AROM'd at last cervical check. Augmentation as ordered as per protocol. Analgesia as needed. FWB: Reassuring fetal heart tracing.  GBS negative Delivery plan: Hopeful for vaginal delivery   Hildred Laserherry, Bowie Doiron, MD [facility]

## 2018-01-07 NOTE — Progress Notes (Signed)
Pt arrived with complaints of contractions every 2-4 minutes that began at 1700 on 9/23. She rates them 9/10. Denies LOF. Reports positive FM. EFM placed.

## 2018-01-08 LAB — CBC
HEMATOCRIT: 29.4 % — AB (ref 35.0–47.0)
Hemoglobin: 10.2 g/dL — ABNORMAL LOW (ref 12.0–16.0)
MCH: 28.9 pg (ref 26.0–34.0)
MCHC: 34.6 g/dL (ref 32.0–36.0)
MCV: 83.6 fL (ref 80.0–100.0)
Platelets: 221 10*3/uL (ref 150–440)
RBC: 3.52 MIL/uL — ABNORMAL LOW (ref 3.80–5.20)
RDW: 15.5 % — AB (ref 11.5–14.5)
WBC: 15.5 10*3/uL — ABNORMAL HIGH (ref 3.6–11.0)

## 2018-01-08 LAB — RPR: RPR Ser Ql: NONREACTIVE

## 2018-01-08 NOTE — Progress Notes (Signed)
Post Partum Day # 1, s/p SVD  Subjective: no complaints, up ad lib, voiding and tolerating PO  Objective: Temp:  [97.6 F (36.4 C)-99 F (37.2 C)] 97.6 F (36.4 C) (09/25 0420) Pulse Rate:  [70-136] 70 (09/25 0420) Resp:  [16-18] 18 (09/25 0420) BP: (103-141)/(62-94) 113/62 (09/25 0420) SpO2:  [93 %-98 %] 98 % (09/25 0420)  Physical Exam:  General: alert and no distress  Lungs: clear to auscultation bilaterally Breasts: normal appearance, no masses or tenderness Heart: regular rate and rhythm, S1, S2 normal, no murmur, click, rub or gallop Abdomen: soft, non-tender; bowel sounds normal; no masses,  no organomegaly Pelvis: Lochia: appropriate, Uterine Fundus: firm Extremities: DVT Evaluation: No evidence of DVT seen on physical exam.  Negative Homan's sign. No cords or calf tenderness. No significant calf/ankle edema.  Recent Labs    01/07/18 1048 01/08/18 0517  HGB 11.9* 10.2*  HCT 34.9* 29.4*    Assessment/Plan: Doing well postpartum.  Breastfeeding, Lactation consult  Mild anemia of pregnancy, continue to treat postpartum with PNV with iron. Contraception undecided. To discuss further at postpartum visit.  Plan for discharge tomorrow   LOS: 1 day   Hildred Laser, MD [facility] 01/08/2018 7:33 AM

## 2018-01-08 NOTE — Lactation Note (Signed)
This note was copied from a baby's chart. Lactation Consultation Note  Patient Name: Cassie Garza WNUUV'O Date: 01/08/2018 Reason for consult: Follow-up assessment   Maternal Data    Feeding Feeding Type: Breast Fed Length of feed: 5 min(per mom, mom latched, LC assist with position)  LATCH Score Latch: Grasps breast easily, tongue down, lips flanged, rhythmical sucking.  Audible Swallowing: Spontaneous and intermittent  Type of Nipple: Everted at rest and after stimulation  Comfort (Breast/Nipple): Soft / non-tender  Hold (Positioning): Assistance needed to correctly position infant at breast and maintain latch.  LATCH Score: 9  Interventions Interventions: Adjust position  Lactation Tools Discussed/Used     Consult Status Consult Status: PRN LC called to room to assist with latch and positioning. Mother states that infant easily latches to the right breast but is having difficulty latching on the left side. When [facility] arrived mother had infant latched to the left side in an awkward position with her slumped over sitting on the side of the bed. Swallows were heard in this position. When infant came off the breast mother's nipple was lip-stick shaped. Mother educated that she needs to have more breast tissue in infant's mouth and that infant needs to be in a higher position. LC helped mother to sit in the recliner and positioned infant in the football hold on the left side. Mother stated that this was a much better position and she felt more comfortable. Audible swallows heard.    Arlyss Gandy 01/08/2018, 4:13 PM

## 2018-01-09 MED ORDER — IBUPROFEN 800 MG PO TABS: 800.0000 mg | ORAL_TABLET | Freq: Three times a day (TID) | ORAL | 1 refills | Status: DC

## 2018-01-09 NOTE — Discharge Instructions (Signed)
Please call your doctor or return to the ER if you experience any chest pains, shortness of breath, dizziness, visual changes, fever greater than 101, any heavy bleeding (saturating more than 1 pad per hour), large clots, or foul smelling discharge, any worsening abdominal pain and cramping that is not controlled by pain medication, or any signs of postpartum depression. No tampons, enemas, douches, or sexual intercourse for 6 weeks. Also avoid tub baths, hot tubs, or swimming for 6 weeks.  °

## 2018-01-09 NOTE — Discharge Summary (Signed)
OB Discharge Summary     Patient Name: Cassie Garza DOB: 01-Feb-1991 MRN: [id number]  Date of admission: 01/07/2018 Delivering MD: Hildred Laser   Date of discharge: 01/09/2018  Admitting diagnosis: 39 wks preg contractions Intrauterine pregnancy: [redacted]w[redacted]d     Secondary diagnosis:  Active Problems:   Pregnancy   Labor and delivery indication for care or intervention  Additional problems: Cervical incompetence, cerclage present     Discharge diagnosis: Term Pregnancy Delivered and Anemia                                                                                                Post partum procedures:] None  Augmentation: AROM  Complications: None  Hospital course:  Onset of Labor With Vaginal Delivery     27 y.o. yo G1P1001 at [redacted]w[redacted]d was admitted in Latent Labor on 01/07/2018. Patient had an uncomplicated labor course as follows:  Membrane Rupture Time/Date: 10:17 AM ,01/07/2018   Intrapartum Procedures: Episiotomy: None [1]                                         Lacerations:  None [1]  Patient had a delivery of a Viable infant. 01/07/2018  Information for the patient's newborn:  Cassie Garza [[id number]]  Delivery Method: Vag-Spont    Pateint had an uncomplicated postpartum course.  She is ambulating, tolerating a regular diet, passing flatus, and urinating well. Patient is discharged home in stable condition on 01/09/18.   Physical exam  Vitals:   01/08/18 0734 01/08/18 1620 01/08/18 2352 01/09/18 0836  BP: 98/61 (!) 99/58 109/82 113/78  Pulse: 76 89 72 60  Resp: 18 18 18 20   Temp: 98 F (36.7 C) 98.5 F (36.9 C) 98.3 F (36.8 C) 98 F (36.7 C)  TempSrc: Oral Oral Oral Oral  SpO2: 97% 96%  97%  Weight:      Height:       General: alert and no distress Lochia: appropriate Uterine Fundus: firm Incision: N/A DVT Evaluation: No evidence of DVT seen on physical exam.  Negative Homan's sign. No cords or calf tenderness. No  significant calf/ankle edema.   Labs: Lab Results  Component Value Date   WBC 15.5 (H) 01/08/2018   HGB 10.2 (L) 01/08/2018   HCT 29.4 (L) 01/08/2018   MCV 83.6 01/08/2018   PLT 221 01/08/2018   CMP Latest Ref Rng & Units 07/27/2017  Glucose 65 - 99 mg/dL 782(N)  BUN 6 - 20 mg/dL 7  Creatinine 5.62 - 1.30 mg/dL 8.65  Sodium 784 - 696 mmol/L 132(L)  Potassium 3.5 - 5.1 mmol/L 3.7  Chloride 101 - 111 mmol/L 104  CO2 22 - 32 mmol/L 23  Calcium 8.9 - 10.3 mg/dL 9.3    Discharge instruction: per After Visit Summary and "Baby and Me Booklet".  After visit meds:  Allergies as of 01/09/2018   No Known Allergies     Medication List    STOP taking these medications   hydrocortisone  cream 0.5 %     TAKE these medications   CONCEPT DHA 53.5-38-1 MG Caps Take 1 tablet by mouth daily.   ibuprofen 800 MG tablet Commonly known as:  ADVIL,MOTRIN Take 1 tablet (800 mg total) by mouth every 8 (eight) hours.   metroNIDAZOLE 500 MG tablet Commonly known as:  FLAGYL Take 1 tablet (500 mg total) by mouth 2 (two) times daily.       Diet: routine diet  Activity: Advance as tolerated. Pelvic rest for 6 weeks.   Outpatient follow up:6 weeks Follow up Appt:No future appointments. Follow up Visit:No follow-ups on file.  Postpartum contraception: Lactational amenorrhea  Newborn Data: Live born female  Birth Weight: 7 lb 4.8 oz (3310 g) APGAR: 8, 9  Newborn Delivery   Birth date/time:  01/07/2018 15:42:00 Delivery type:  Vaginal, Spontaneous     Baby Feeding: Breast Disposition:home with mother   01/09/2018 Hildred Laser, MD  [facility]

## 2018-01-09 NOTE — Progress Notes (Signed)
Discharge order received from doctor. Reviewed discharge instructions and prescriptions with patient and answered all questions. Follow up appointment instructions given. Patient verbalized understanding. ID bands checked. Patient discharged home with infant via wheelchair by nursing/auxillary.    Pamalee Marcoe Garner, RN  

## 2018-01-10 ENCOUNTER — Encounter: Payer: BLUE CROSS/BLUE SHIELD | Admitting: Obstetrics and Gynecology

## 2018-01-20 ENCOUNTER — Other Ambulatory Visit: Payer: Self-pay

## 2018-01-20 NOTE — Telephone Encounter (Signed)
Pt was called LM via voicemail informing pt that her FMLA paper work had been completed and faxed in. Pt was advised if she had any questions or concerns to please call the office.

## 2018-02-20 ENCOUNTER — Encounter: Payer: Self-pay | Admitting: Obstetrics and Gynecology

## 2018-02-20 ENCOUNTER — Ambulatory Visit (INDEPENDENT_AMBULATORY_CARE_PROVIDER_SITE_OTHER): Payer: BLUE CROSS/BLUE SHIELD | Admitting: Obstetrics and Gynecology

## 2018-02-20 DIAGNOSIS — Z1389 Encounter for screening for other disorder: Secondary | ICD-10-CM

## 2018-02-20 DIAGNOSIS — O99345 Other mental disorders complicating the puerperium: Secondary | ICD-10-CM

## 2018-02-20 DIAGNOSIS — F53 Postpartum depression: Secondary | ICD-10-CM

## 2018-02-20 DIAGNOSIS — M5432 Sciatica, left side: Secondary | ICD-10-CM

## 2018-02-20 NOTE — Progress Notes (Signed)
PT is present today for her postpartum visit. Pt stated that she is breastfeeding and had sexually intercourse recently on Sunday. Pt stated that she do not want any form of birth control at this time. EPDS=10 .  Pt stated that she is doing well no complaints.

## 2018-02-20 NOTE — Progress Notes (Signed)
   OBSTETRICS POSTPARTUM CLINIC PROGRESS NOTE  Subjective:     Cassie Garza is a 27 y.o. G31P1001 female who presents for a postpartum visit. She is 6 weeks postpartum following a spontaneous vaginal delivery. I have fully reviewed the prenatal and intrapartum course. The delivery was at  gestational weeks.  Anesthesia: epidural. Postpartum course has been well. Baby's course has been well. Baby is feeding by breast. Bleeding: patient has/has not resumed menses, with Patient's last menstrual period was 04/08/2017. Bowel function is normal. Bladder function is normal. Patient is not sexually active. Contraception method desired is none (lactational). Postpartum depression screening: positive, EPDS = 10.  The following portions of the patient's history were reviewed and updated as appropriate: allergies, current medications, past family history, past medical history, past social history, past surgical history and problem list.  Review of Systems A comprehensive review of systems was negative except for: Neurological: positive for left sciatic pain.  Notes it had initially improved after pregnancy, however has returned again   Objective:    BP 103/67   Pulse 78   Ht 5\' 2"  (1.575 m)   Wt 186 lb 8 oz (84.6 kg)   LMP 04/08/2017   Breastfeeding? Yes   BMI 34.11 kg/m   General:  alert and no distress   Breasts:  inspection negative, no nipple discharge or bleeding, no masses or nodularity palpable  Lungs: clear to auscultation bilaterally  Heart:  regular rate and rhythm, S1, S2 normal, no murmur, click, rub or gallop  Abdomen: soft, non-tender; bowel sounds normal; no masses,  no organomegaly.     Vulva:  normal  Vagina: normal vagina, no discharge, exudate, lesion, or erythema  Cervix:  no cervical motion tenderness and no lesions  Corpus: normal size, contour, position, consistency, mobility, non-tender  Adnexa:  normal adnexa and no mass, fullness, tenderness  Rectal Exam: Not  performed.         Labs:  Lab Results  Component Value Date   HGB 10.2 (L) 01/08/2018     Assessment:    Routine postpartum exam.    Postpartum depression  Sciatica  Plan:    1. Contraception: none currently.  Will use lactational amenorrhea. However may consider in the near future. Given handout on contraception.  2. Discussion had on postpartum depression screening.  Patient notes she thinks it is just being overwhelmed with being a new mom but feels she has good support and has a handle on things. Declines counseling/medications at this time.  3. Sciatica - discussed stretching, chiropractor. Given handout.  3. Follow up in: 6 months for annual exam as needed.    Hildred Laser, MD [facility]

## 2018-02-20 NOTE — Patient Instructions (Signed)
Sciatica Sciatica is pain, numbness, weakness, or tingling along the path of the sciatic nerve. The sciatic nerve starts in the lower back and runs down the back of each leg. The nerve controls the muscles in the lower leg and in the back of the knee. It also provides feeling (sensation) to the back of the thigh, the lower leg, and the sole of the foot. Sciatica is a symptom of another medical condition that pinches or puts pressure on the sciatic nerve. Generally, sciatica only affects one side of the body. Sciatica usually goes away on its own or with treatment. In some cases, sciatica may keep coming back (recur). What are the causes? This condition is caused by pressure on the sciatic nerve, or pinching of the sciatic nerve. This may be the result of:  A disk in between the bones of the spine (vertebrae) bulging out too far (herniated disk).  Age-related changes in the spinal disks (degenerative disk disease).  A pain disorder that affects a muscle in the buttock (piriformis syndrome).  Extra bone growth (bone spur) near the sciatic nerve.  An injury or break (fracture) of the pelvis.  Pregnancy.  Tumor (rare).  What increases the risk? The following factors may make you more likely to develop this condition:  Playing sports that place pressure or stress on the spine, such as football or weight lifting.  Having poor strength and flexibility.  A history of back injury.  A history of back surgery.  Sitting for long periods of time.  Doing activities that involve repetitive bending or lifting.  Obesity.  What are the signs or symptoms? Symptoms can vary from mild to very severe, and they may include:  Any of these problems in the lower back, leg, hip, or buttock: ? Mild tingling or dull aches. ? Burning sensations. ? Sharp pains.  Numbness in the back of the calf or the sole of the foot.  Leg weakness.  Severe back pain that makes movement difficult.  These  symptoms may get worse when you cough, sneeze, or laugh, or when you sit or stand for long periods of time. Being overweight may also make symptoms worse. In some cases, symptoms may recur over time. How is this diagnosed? This condition may be diagnosed based on:  Your symptoms.  A physical exam. Your health care provider may ask you to do certain movements to check whether those movements trigger your symptoms.  You may have tests, including: ? Blood tests. ? X-rays. ? MRI. ? CT scan.  How is this treated? In many cases, this condition improves on its own, without any treatment. However, treatment may include:  Reducing or modifying physical activity during periods of pain.  Exercising and stretching to strengthen your abdomen and improve the flexibility of your spine.  Icing and applying heat to the affected area.  Medicines that help: ? To relieve pain and swelling. ? To relax your muscles.  Injections of medicines that help to relieve pain, irritation, and inflammation around the sciatic nerve (steroids).  Surgery.  Follow these instructions at home: Medicines  Take over-the-counter and prescription medicines only as told by your health care provider.  Do not drive or operate heavy machinery while taking prescription pain medicine. Managing pain  If directed, apply ice to the affected area. ? Put ice in a plastic bag. ? Place a towel between your skin and the bag. ? Leave the ice on for 20 minutes, 2-3 times a day.  After icing, apply   heat to the affected area before you exercise or as often as told by your health care provider. Use the heat source that your health care provider recommends, such as a moist heat pack or a heating pad. ? Place a towel between your skin and the heat source. ? Leave the heat on for 20-30 minutes. ? Remove the heat if your skin turns bright red. This is especially important if you are unable to feel pain, heat, or cold. You may have a  greater risk of getting burned. Activity  Return to your normal activities as told by your health care provider. Ask your health care provider what activities are safe for you. ? Avoid activities that make your symptoms worse.  Take brief periods of rest throughout the day. Resting in a lying or standing position is usually better than sitting to rest. ? When you rest for longer periods, mix in some mild activity or stretching between periods of rest. This will help to prevent stiffness and pain. ? Avoid sitting for long periods of time without moving. Get up and move around at least one time each hour.  Exercise and stretch regularly, as told by your health care provider.  Do not lift anything that is heavier than 10 lb (4.5 kg) while you have symptoms of sciatica. When you do not have symptoms, you should still avoid heavy lifting, especially repetitive heavy lifting.  When you lift objects, always use proper lifting technique, which includes: ? Bending your knees. ? Keeping the load close to your body. ? Avoiding twisting. General instructions  Use good posture. ? Avoid leaning forward while sitting. ? Avoid hunching over while standing.  Maintain a healthy weight. Excess weight puts extra stress on your back and makes it difficult to maintain good posture.  Wear supportive, comfortable shoes. Avoid wearing high heels.  Avoid sleeping on a mattress that is too soft or too hard. A mattress that is firm enough to support your back when you sleep may help to reduce your pain.  Keep all follow-up visits as told by your health care provider. This is important. Contact a health care provider if:  You have pain that wakes you up when you are sleeping.  You have pain that gets worse when you lie down.  Your pain is worse than you have experienced in the past.  Your pain lasts longer than 4 weeks.  You experience unexplained weight loss. Get help right away if:  You lose control  of your bowel or bladder (incontinence).  You have: ? Weakness in your lower back, pelvis, buttocks, or legs that gets worse. ? Redness or swelling of your back. ? A burning sensation when you urinate. This information is not intended to replace advice given to you by your health care provider. Make sure you discuss any questions you have with your health care provider. Document Released: 03/27/2001 Document Revised: 09/06/2015 Document Reviewed: 12/10/2014 Elsevier Interactive Patient Education  2018 [employer].  

## 2018-04-24 ENCOUNTER — Telehealth: Payer: Self-pay | Admitting: Obstetrics and Gynecology

## 2018-04-24 NOTE — Telephone Encounter (Signed)
Spoke with pt and informed her that the breast pump form would be filled out and faxed in tomorrow since [facility] will have to sign it and she is not in the office today. Pt was informed that the company would be contacting her once they receive her form. Pt stated that she understood.

## 2018-04-24 NOTE — Telephone Encounter (Signed)
The patient is asking for orders for her to get a breast pump through [facility] as they need it from an authorized physician.  Please advise, thanks.

## 2018-08-21 ENCOUNTER — Encounter: Payer: BLUE CROSS/BLUE SHIELD | Admitting: Obstetrics and Gynecology

## 2018-11-07 ENCOUNTER — Ambulatory Visit (INDEPENDENT_AMBULATORY_CARE_PROVIDER_SITE_OTHER): Payer: Managed Care, Other (non HMO) | Admitting: Obstetrics and Gynecology

## 2018-11-07 ENCOUNTER — Encounter: Payer: Self-pay | Admitting: Obstetrics and Gynecology

## 2018-11-07 ENCOUNTER — Other Ambulatory Visit: Payer: Self-pay

## 2018-11-07 VITALS — BP 116/79 | HR 76 | Ht 62.0 in | Wt 197.1 lb

## 2018-11-07 DIAGNOSIS — Z01419 Encounter for gynecological examination (general) (routine) without abnormal findings: Secondary | ICD-10-CM

## 2018-11-07 DIAGNOSIS — Z862 Personal history of diseases of the blood and blood-forming organs and certain disorders involving the immune mechanism: Secondary | ICD-10-CM

## 2018-11-07 DIAGNOSIS — Z6830 Body mass index (BMI) 30.0-30.9, adult: Secondary | ICD-10-CM

## 2018-11-07 NOTE — Patient Instructions (Signed)
Preventive Care 20-30 Years Old, Female Preventive care refers to visits with your health care provider and lifestyle choices that can promote health and wellness. This includes:  A yearly physical exam. This may also be called an annual well check.  Regular dental visits and eye exams.  Immunizations.  Screening for certain conditions.  Healthy lifestyle choices, such as eating a healthy diet, getting regular exercise, not using drugs or products that contain nicotine and tobacco, and limiting alcohol use. What can I expect for my preventive care visit? Physical exam Your health care provider will check your:  Height and weight. This may be used to calculate body mass index (BMI), which tells if you are at a healthy weight.  Heart rate and blood pressure.  Skin for abnormal spots. Counseling Your health care provider may ask you questions about your:  Alcohol, tobacco, and drug use.  Emotional well-being.  Home and relationship well-being.  Sexual activity.  Eating habits.  Work and work Statistician.  Method of birth control.  Menstrual cycle.  Pregnancy history. What immunizations do I need?  Influenza (flu) vaccine  This is recommended every year. Tetanus, diphtheria, and pertussis (Tdap) vaccine  You may need a Td booster every 10 years. Varicella (chickenpox) vaccine  You may need this if you have not been vaccinated. Human papillomavirus (HPV) vaccine  If recommended by your health care provider, you may need three doses over 6 months. Measles, mumps, and rubella (MMR) vaccine  You may need at least one dose of MMR. You may also need a second dose. Meningococcal conjugate (MenACWY) vaccine  One dose is recommended if you are age 75-21 years and a first-year college student living in a residence hall, or if you have one of several medical conditions. You may also need additional booster doses. Pneumococcal conjugate (PCV13) vaccine  You may need  this if you have certain conditions and were not previously vaccinated. Pneumococcal polysaccharide (PPSV23) vaccine  You may need one or two doses if you smoke cigarettes or if you have certain conditions. Hepatitis A vaccine  You may need this if you have certain conditions or if you travel or work in places where you may be exposed to hepatitis A. Hepatitis B vaccine  You may need this if you have certain conditions or if you travel or work in places where you may be exposed to hepatitis B. Haemophilus influenzae type b (Hib) vaccine  You may need this if you have certain conditions. You may receive vaccines as individual doses or as more than one vaccine together in one shot (combination vaccines). Talk with your health care provider about the risks and benefits of combination vaccines. What tests do I need?  Blood tests  Lipid and cholesterol levels. These may be checked every 5 years starting at age 33.  Hepatitis C test.  Hepatitis B test. Screening  Diabetes screening. This is done by checking your blood sugar (glucose) after you have not eaten for a while (fasting).  Sexually transmitted disease (STD) testing.  BRCA-related cancer screening. This may be done if you have a family history of breast, ovarian, tubal, or peritoneal cancers.  Pelvic exam and Pap test. This may be done every 3 years starting at age 76. Starting at age 102, this may be done every 5 years if you have a Pap test in combination with an HPV test. Talk with your health care provider about your test results, treatment options, and if necessary, the need for more tests.  Follow these instructions at home: °Eating and drinking ° °· Eat a diet that includes fresh fruits and vegetables, whole grains, lean protein, and low-fat dairy. °· Take vitamin and mineral supplements as recommended by your health care provider. °· Do not drink alcohol if: °? Your health care provider tells you not to drink. °? You are  pregnant, may be pregnant, or are planning to become pregnant. °· If you drink alcohol: °? Limit how much you have to 0-1 drink a day. °? Be aware of how much alcohol is in your drink. In the [place] one drink equals one 12 oz bottle of beer (355 mL), one 5 oz glass of wine (148 mL), or one 1½ oz glass of hard liquor (44 mL). °Lifestyle °· Take daily care of your teeth and gums. °· Stay active. Exercise for at least 30 minutes on 5 or more days each week. °· Do not use any products that contain nicotine or tobacco, such as cigarettes, e-cigarettes, and chewing tobacco. If you need help quitting, ask your health care provider. °· If you are sexually active, practice safe sex. Use a condom or other form of birth control (contraception) in order to prevent pregnancy and STIs (sexually transmitted infections). If you plan to become pregnant, see your health care provider for a preconception visit. °What's next? °· Visit your health care provider once a year for a well check visit. °· Ask your health care provider how often you should have your eyes and teeth checked. °· Stay up to date on all vaccines. °This information is not intended to replace advice given to you by your health care provider. Make sure you discuss any questions you have with your health care provider. °Document Released: 05/29/2001 Document Revised: 12/12/2017 Document Reviewed: 12/12/2017 °Elsevier Patient Education © 2020 [employer] Inc[employer] °Breast Self-Awareness °Breast self-awareness is knowing how your breasts look and feel. Doing breast self-awareness is important. It allows you to catch a breast problem early while it is still small and can be treated. All women should do breast self-awareness, including women who have had breast implants. Tell your doctor if you notice a change in your breasts. °What you need: °· A mirror. °· A well-lit room. °How to do a breast self-exam °A breast self-exam is one way to learn what is normal for your breasts and to  check for changes. To do a breast self-exam: °Look for changes ° °1. Take off all the clothes above your waist. °2. Stand in front of a mirror in a room with good lighting. °3. Put your hands on your hips. °4. Push your hands down. °5. Look at your breasts and nipples in the mirror to see if one breast or nipple looks different from the other. Check to see if: °? The shape of one breast is different. °? The size of one breast is different. °? There are wrinkles, dips, and bumps in one breast and not the other. °6. Look at each breast for changes in the skin, such as: °? Redness. °? Scaly areas. °7. Look for changes in your nipples, such as: °? Liquid around the nipples. °? Bleeding. °? Dimpling. °? Redness. °? A change in where the nipples are. °Feel for changes ° °1. Lie on your back on the floor. °2. Feel each breast. To do this, follow these steps: °? Pick a breast to feel. °? Put the arm closest to that breast above your head. °? Use your other arm to feel the nipple area of   your breast. Feel the area with the pads of your three middle fingers by making small circles with your fingers. For the first circle, press lightly. For the second circle, press harder. For the third circle, press even harder. °? Keep making circles with your fingers at the different pressures as you move down your breast. Stop when you feel your ribs. °? Move your fingers a little toward the center of your body. °? Start making circles with your fingers again, this time going up until you reach your collarbone. °? Keep making up-and-down circles until you reach your armpit. Remember to keep using the three pressures. °? Feel the other breast in the same way. °3. Sit or stand in the tub or shower. °4. With soapy water on your skin, feel each breast the same way you did in step 2 when you were lying on the floor. °Write down what you find °Writing down what you find can help you remember what to tell your doctor. Write down: °· What is  normal for each breast. °· Any changes you find in each breast, including: °? The kind of changes you find. °? Whether you have pain. °? Size and location of any lumps. °· When you last had your menstrual period. °General tips °· Check your breasts every month. °· If you are breastfeeding, the best time to check your breasts is after you feed your baby or after you use a breast pump. °· If you get menstrual periods, the best time to check your breasts is 5-7 days after your menstrual period is over. °· With time, you will become comfortable with the self-exam, and you will begin to know if there are changes in your breasts. °Contact a doctor if you: °· See a change in the shape or size of your breasts or nipples. °· See a change in the skin of your breast or nipples, such as red or scaly skin. °· Have fluid coming from your nipples that is not normal. °· Find a lump or thick area that was not there before. °· Have pain in your breasts. °· Have any concerns about your breast health. °Summary °· Breast self-awareness includes looking for changes in your breasts, as well as feeling for changes within your breasts. °· Breast self-awareness should be done in front of a mirror in a well-lit room. °· You should check your breasts every month. If you get menstrual periods, the best time to check your breasts is 5-7 days after your menstrual period is over. °· Let your doctor know of any changes you see in your breasts, including changes in size, changes on the skin, pain or tenderness, or fluid from your nipples that is not normal. °This information is not intended to replace advice given to you by your health care provider. Make sure you discuss any questions you have with your health care provider. °Document Released: 09/19/2007 Document Revised: 11/19/2017 Document Reviewed: 11/19/2017 °Elsevier Patient Education © 2020 [employer] Inc. ° °

## 2018-11-07 NOTE — Progress Notes (Signed)
Pt is present for annual exam. Pt stated that she was doing well and denies any issues at this time.

## 2018-11-07 NOTE — Progress Notes (Signed)
GYNECOLOGY ANNUAL PHYSICAL EXAM PROGRESS NOTE  Subjective:    Cassie Garza is a 28 y.o. 151P1001 female who presents for an annual exam. The patient has no complaints today. The patient is currently sexually active. Nothing for birth control (except occasional condoms), though she is still breastfeeding. Not interested in contraception right now. She has started her period again since giving birth; it's a little crampy but not terrible. Has the patient ever been transfused or tattooed?: she has tattoos. The patient reports that there is no domestic violence in her life.   She doesn't have any new issues/problems she'd like to discuss today. The patient does not have a PCP. She tries to get regular exercise and eat healthy diet, but has been struggling with this lately.   Gynecologic History LMP ~ 2-3 weeks ago, cannot recall exact date.  Contraception: none right now  History of STI's: Chlamydia, no other STI hx per pt Last Pap: 07/05/2017, normal    OB History  Gravida Para Term Preterm AB Living  1 1 1  0 0 1  SAB TAB Ectopic Multiple Live Births  0 0 0 0 1    # Outcome Date GA Lbr Len/2nd Weight Sex Delivery Anes PTL Lv  1 Term 01/07/18 316w1d / 00:43 3310 g M Vag-Spont Local  LIV     Name: BRAMBILA Garza,BOY Irmalee     Apgar1: 8  Apgar5: 9    Obstetric Comments  History of rescue cerclage in 2nd trimester    Past Medical History:  Diagnosis Date   Chlamydia infection affecting pregnancy 07/15/2017   - Treated 4/1 [ ]  needs TOC early May    Past Surgical History:  Procedure Laterality Date   CERVICAL CERCLAGE N/A 07/29/2017   Procedure: CERCLAGE CERVICAL;  Surgeon: Natale MilchSchuman, Christanna R, MD;  Location: [facility] ORS;  Service: Gynecology;  Laterality: N/A;   NO PAST SURGERIES      Family History  Problem Relation Age of Onset   Cancer Brother 7       LEUKEMIA   Diabetes Maternal Grandmother    Hypertension Maternal Grandmother     Social  History   Socioeconomic History   Marital status: Single    Spouse name: Not on file   Number of children: 0   Years of education: 13   Highest education level: Not on file  Occupational History   Occupation: [employer]    Comment: [place]  [employer]   Financial resource strain: Not on file   Food insecurity    Worry: Not on file    Inability: Not on file   Transportation needs    Medical: Not on file    Non-medical: Not on file  Tobacco Use   Smoking status: Never Smoker   Smokeless tobacco: Never Used  Substance and Sexual Activity   Alcohol use: Yes   Drug use: Never   Sexual activity: Yes    Birth control/protection: None, Condom  Lifestyle   Physical activity    Days per week: Not on file    Minutes per session: Not on file   Stress: Not on file  Relationships   Social connections    Talks on phone: Not on file    Gets together: Not on file    Attends religious service: Not on file    Active member of club or organization: Not on file    Attends meetings of clubs or organizations: Not on file    Relationship status:  Not on file   Intimate partner violence    Fear of current or ex partner: Not on file    Emotionally abused: Not on file    Physically abused: Not on file    Forced sexual activity: Not on file  Other Topics Concern   Not on file  Social History Narrative   Not on file    No current outpatient medications on file prior to visit.   No current facility-administered medications on file prior to visit.     No Known Allergies    Review of Systems Constitutional: negative for chills, fatigue, fevers and sweats. Unintentional weight gain  Eyes: negative for irritation, redness and visual disturbance Ears, nose, mouth, throat, and face: negative for hearing loss, nasal congestion, snoring and tinnitus Respiratory: negative for asthma, cough, sputum Cardiovascular: negative for chest pain, dyspnea, exertional chest  pressure/discomfort, irregular heart beat, palpitations and syncope Gastrointestinal: negative for abdominal pain, change in bowel habits, nausea and vomiting Genitourinary: negative for abnormal menstrual periods, genital lesions, sexual problems and vaginal discharge, dysuria and urinary incontinence Integument/breast: negative for breast lump, breast tenderness and nipple discharge Hematologic/lymphatic: negative for bleeding and easy bruising Musculoskeletal:negative for back pain and muscle weakness Neurological: negative for dizziness, headaches, vertigo and weakness Endocrine: negative for diabetic symptoms including polydipsia, polyuria and skin dryness Allergic/Immunologic: negative for hay fever and urticaria        Objective:  Blood pressure 116/79, pulse 76, height 5\' 2"  (1.575 m), weight 89.4 kg, currently breastfeeding. Body mass index is 36.05 kg/m.  General Appearance:    Alert, cooperative, no distress, appears stated age, moderate obesity  Head:    Normocephalic, without obvious abnormality, atraumatic  Eyes:    PERRL, conjunctiva/corneas clear, EOM's intact, both eyes  Ears:    Normal external ear canals, both ears  Nose:   Nares normal, septum midline, mucosa normal, no drainage or sinus tenderness  Throat:   Lips, mucosa, and tongue normal; teeth and gums normal  Neck:   Supple, symmetrical, trachea midline, no adenopathy; thyroid: no enlargement/tenderness/nodules; no carotid bruit or JVD  Back:     Symmetric, no curvature, ROM normal, no CVA tenderness  Lungs:     Clear to auscultation bilaterally, respirations unlabored  Chest Wall:    No tenderness or deformity   Heart:    Regular rate and rhythm, S1 and S2 normal, no murmur, rub or gallop  Breast Exam:    No tenderness, masses, or nipple abnormality  Abdomen:     Soft, non-tender, bowel sounds active all four quadrants, no masses, no organomegaly.    Genitalia:    Pelvic:external genitalia normal, vagina without  lesions, discharge, or tenderness, rectovaginal septum  normal. Cervix normal in appearance, no cervical motion tenderness, no adnexal masses or tenderness.  Uterus normal size, shape, mobile, regular contours, nontender.  Rectal:    Normal external sphincter.  No hemorrhoids appreciated. Internal exam not done.   Extremities:   Extremities normal, atraumatic, no cyanosis or edema  Pulses:   2+ and symmetric all extremities  Skin:   Skin color, texture, turgor normal, no rashes or lesions  Lymph nodes:   Cervical, supraclavicular, and axillary nodes normal  Neurologic:   CNII-XII intact, normal strength, sensation and reflexes throughout   .  Labs:  Lab Results  Component Value Date   WBC 15.5 (H) 01/08/2018   HGB 10.2 (L) 01/08/2018   HCT 29.4 (L) 01/08/2018   MCV 83.6 01/08/2018   PLT 221 01/08/2018  Lab Results  Component Value Date   CREATININE 0.46 07/27/2017   BUN 7 07/27/2017   NA 132 (L) 07/27/2017   K 3.7 07/27/2017   CL 104 07/27/2017   CO2 23 07/27/2017    No results found for: ALT, AST, GGT, ALKPHOS, BILITOT  No results found for: TSH   Assessment:    Healthy female exam.   History of postpartum anemia Lactating mother Obesity Class II   Plan:     All questions answered. Blood tests: CBC with diff and Comprehensive metabolic panel. Breast self exam technique reviewed and patient encouraged to perform self-exam monthly.  Pt still breastfeeding.  Pap smear up to date.   Contraception: currently declines. Uses condoms intermittently. Ok if pregnancy occurs. Encouraged healthy lifestyle changes with diet and exercise. Pt declines STD screening. RTC in 1 year for annual exam.   Richrd HumblesGraham, Laura E, Student-PA [facility]    I have seen and examined the patient with Ignacia BayleyLaura Graham, [place] PA-S.  I have reviewed the record and concur with patient management and plan as documented.   Hildred Laserherry, Ramiro Pangilinan, MD [facility]

## 2018-11-08 LAB — CBC
Hematocrit: 38.8 % (ref 34.0–46.6)
Hemoglobin: 12.9 g/dL (ref 11.1–15.9)
MCH: 28.8 pg (ref 26.6–33.0)
MCHC: 33.2 g/dL (ref 31.5–35.7)
MCV: 87 fL (ref 79–97)
Platelets: 287 10*3/uL (ref 150–450)
RBC: 4.48 x10E6/uL (ref 3.77–5.28)
RDW: 13.9 % (ref 11.7–15.4)
WBC: 7.8 10*3/uL (ref 3.4–10.8)

## 2018-11-08 LAB — COMPREHENSIVE METABOLIC PANEL
ALT: 56 IU/L — ABNORMAL HIGH (ref 0–32)
AST: 36 IU/L (ref 0–40)
Albumin/Globulin Ratio: 1.4 (ref 1.2–2.2)
Albumin: 4.3 g/dL (ref 3.9–5.0)
Alkaline Phosphatase: 121 IU/L — ABNORMAL HIGH (ref 39–117)
BUN/Creatinine Ratio: 23 (ref 9–23)
BUN: 15 mg/dL (ref 6–20)
Bilirubin Total: 0.4 mg/dL (ref 0.0–1.2)
CO2: 21 mmol/L (ref 20–29)
Calcium: 9.5 mg/dL (ref 8.7–10.2)
Chloride: 104 mmol/L (ref 96–106)
Creatinine, Ser: 0.65 mg/dL (ref 0.57–1.00)
GFR calc Af Amer: 139 mL/min/{1.73_m2} (ref >59)
GFR calc non Af Amer: 120 mL/min/{1.73_m2} (ref >59)
Globulin, Total: 3 g/dL (ref 1.5–4.5)
Glucose: 103 mg/dL — ABNORMAL HIGH (ref 65–99)
Potassium: 4.6 mmol/L (ref 3.5–5.2)
Sodium: 139 mmol/L (ref 134–144)
Total Protein: 7.3 g/dL (ref 6.0–8.5)

## 2018-12-10 IMAGING — US US OB LIMITED
1 series · 14 of 28 positions shown · non-contrast
Comparison: none

CLINICAL DATA: Fifteen weeks pregnant, vaginal spotting last night

EXAM:
LIMITED OBSTETRIC ULTRASOUND

[Series 1: us ob limited · 14 of 54 slices shown]
[im 2/54]
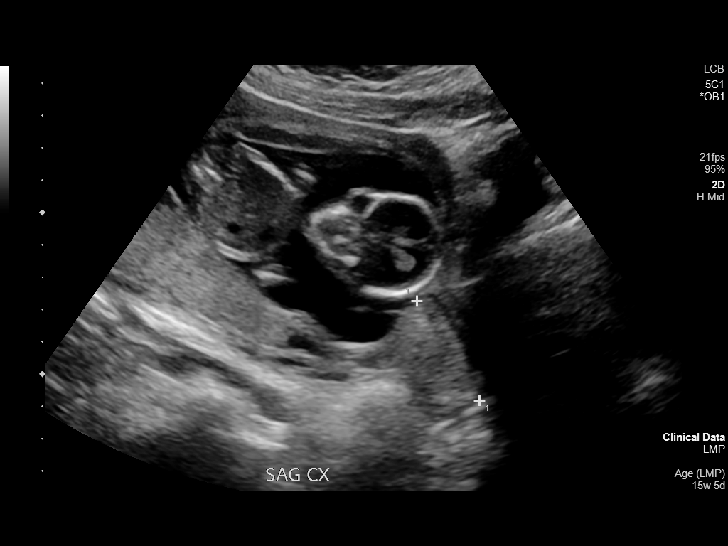
[im 6/54]
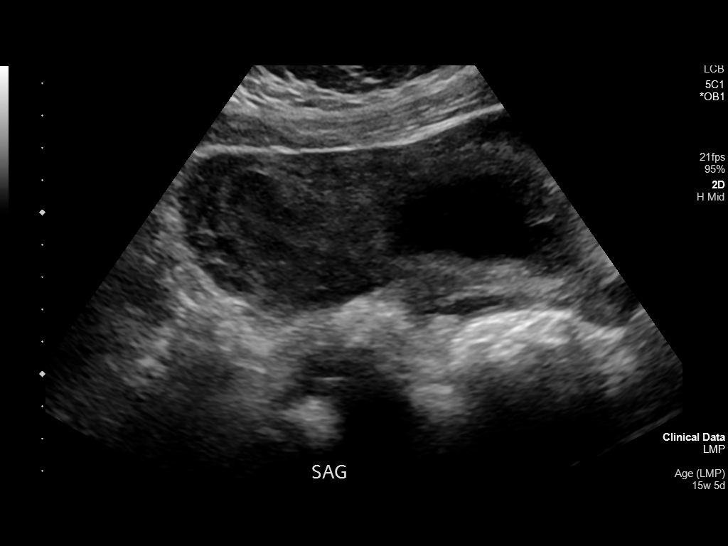
[im 10/54]
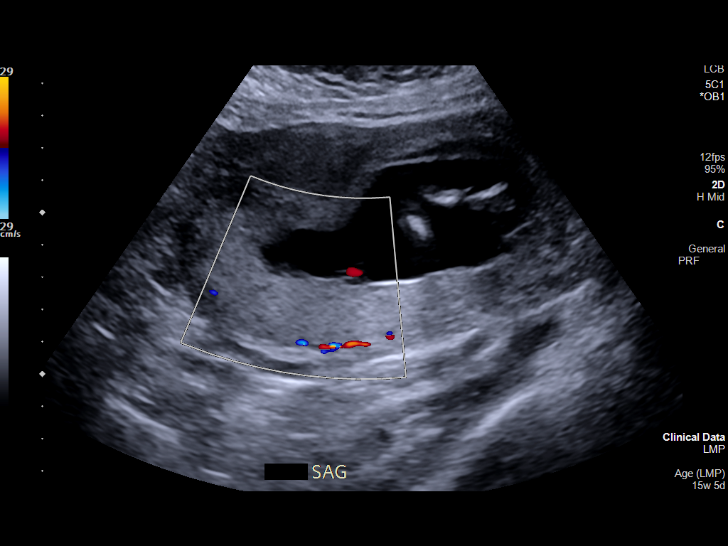
[im 14/54]
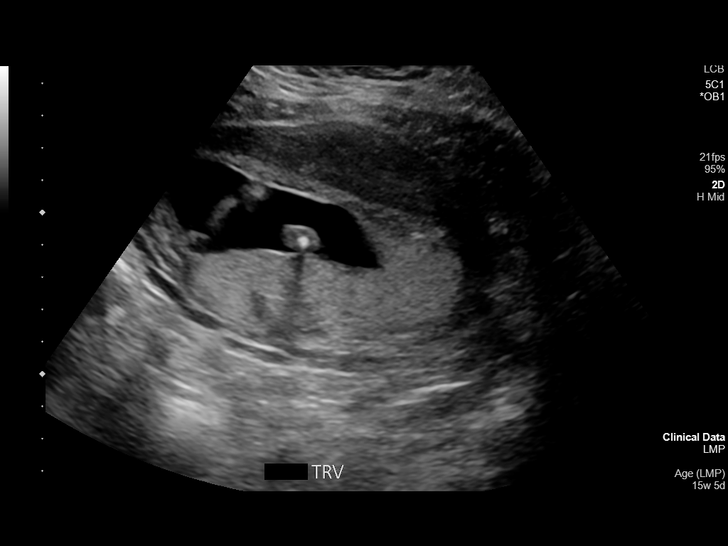
[im 18/54]
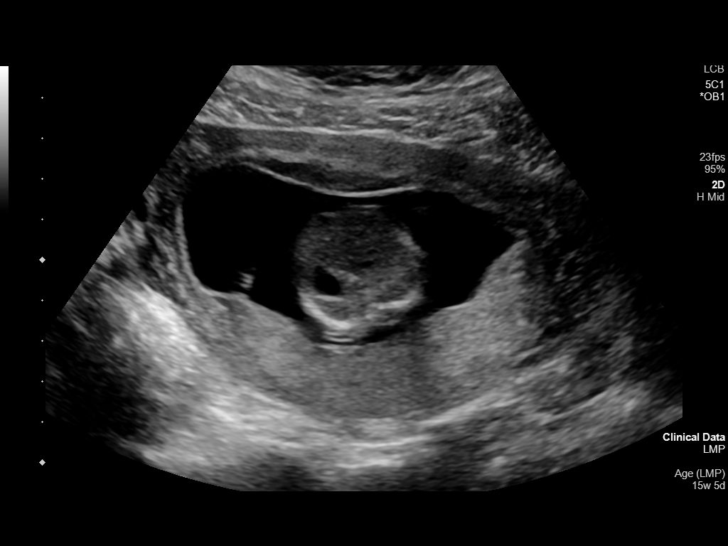
[im 22/54]
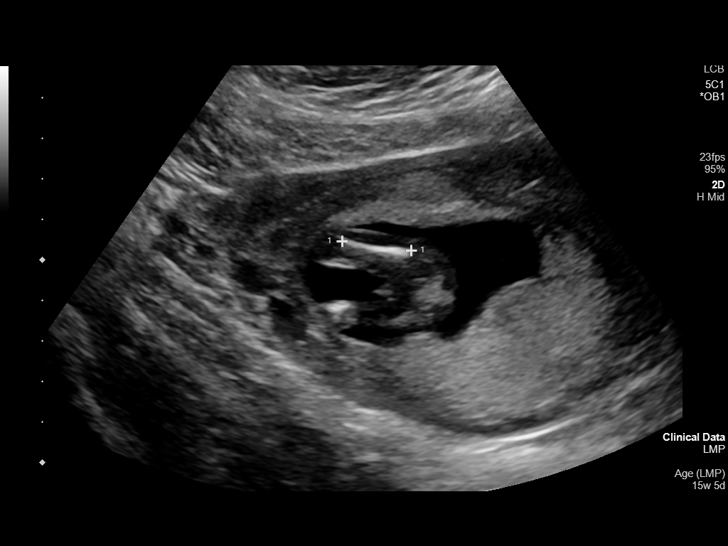
[im 26/54]
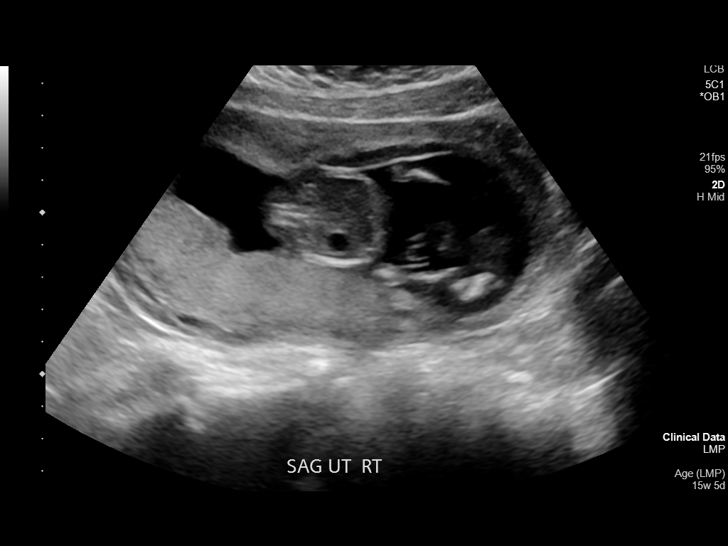
[im 30/54]
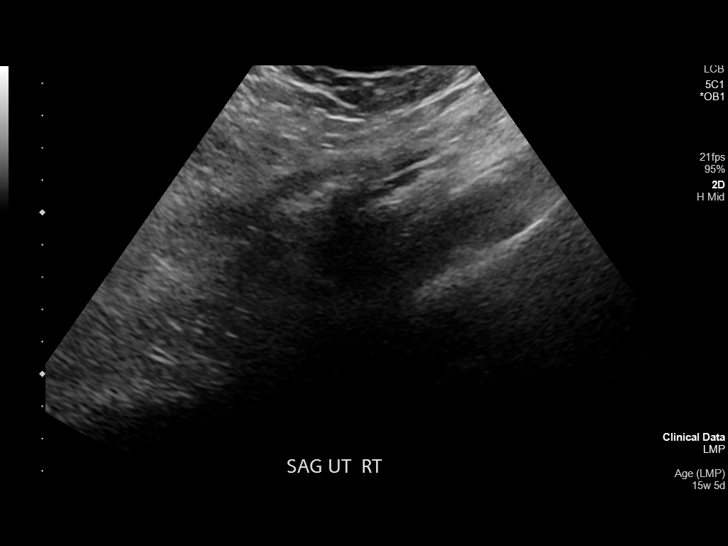
[im 34/54]
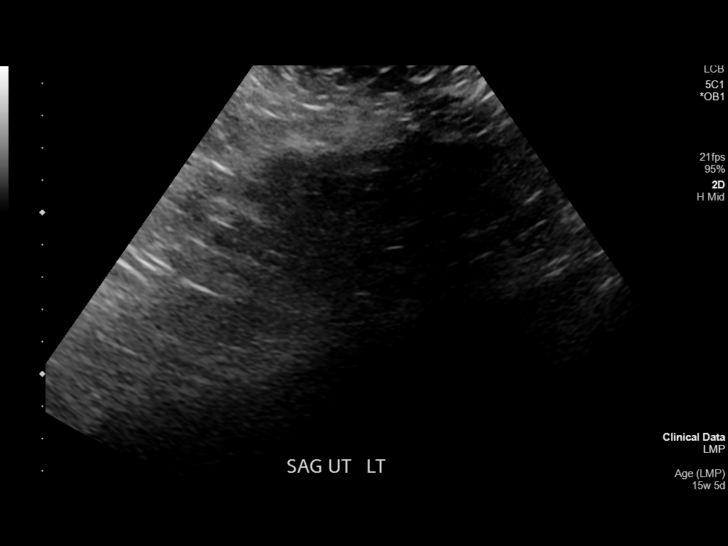
[im 38/54]
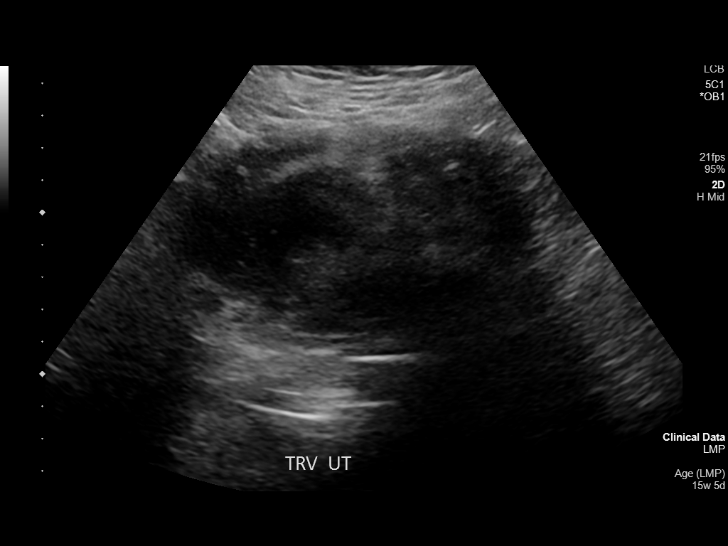
[im 42/54]
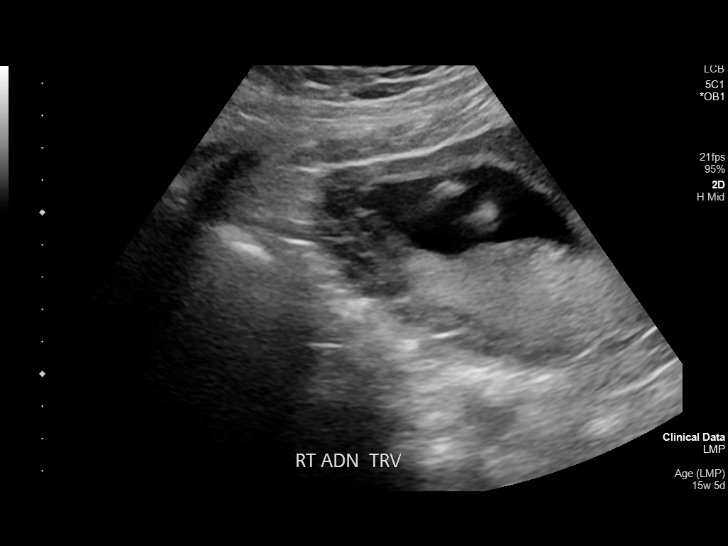
[im 46/54]
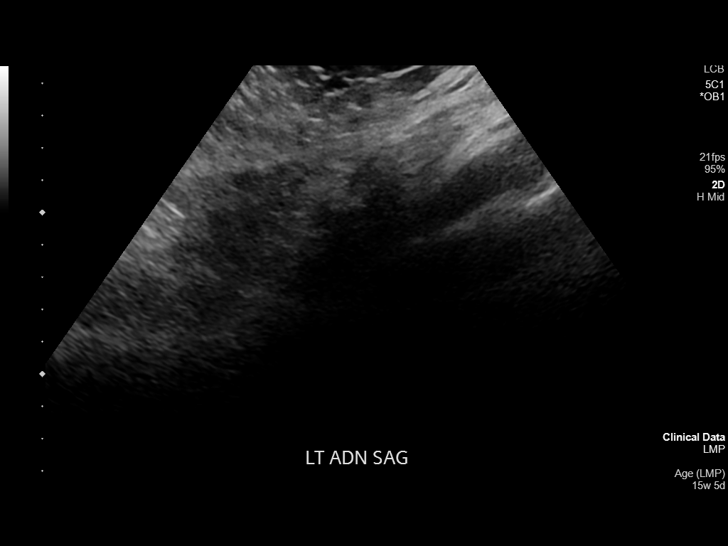
[im 50/54]
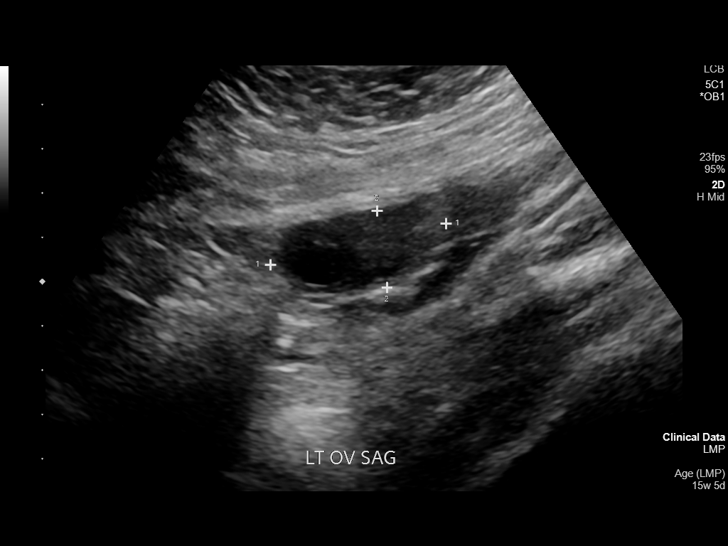
[im 54/54]
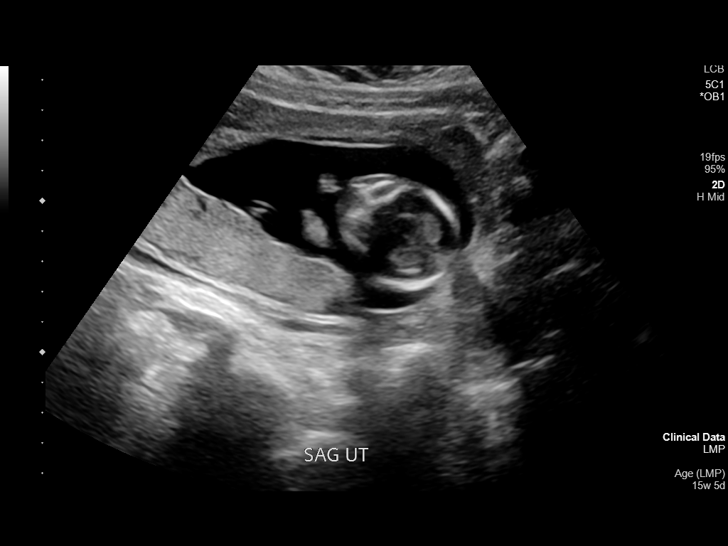

[14 of 28 positions shown; findings below may reference images not displayed]

FINDINGS: Number of Fetuses: 1

Heart Rate:  140 bpm

Movement: Yes

Presentation: Cephalic

Placental Location: Fundal posterior

Previa: No

Amniotic Fluid (Subjective):  Within normal limits.

BPD: 3.2 cm 16 w  1 d

Femur length: 1.8 cm 15 w 2 d

MATERNAL FINDINGS:

Cervix:  Appears closed.  Measures 3.6 cm in length.

Uterus/Adnexae: No abnormality visualized.
IMPRESSION: Single live intrauterine pregnancy with estimated gestational age of
16 weeks and 1 day by BPD measurement.

This exam is performed on an emergent basis and does not
comprehensively evaluate fetal size, dating, or anatomy; follow-up
complete OB US should be considered if further fetal assessment is
warranted.

## 2019-01-16 ENCOUNTER — Other Ambulatory Visit: Payer: Managed Care, Other (non HMO)

## 2019-01-16 ENCOUNTER — Other Ambulatory Visit: Payer: Self-pay

## 2019-01-16 DIAGNOSIS — R7989 Other specified abnormal findings of blood chemistry: Secondary | ICD-10-CM

## 2019-01-16 DIAGNOSIS — Z01419 Encounter for gynecological examination (general) (routine) without abnormal findings: Secondary | ICD-10-CM

## 2019-01-16 NOTE — Addendum Note (Signed)
Addended by: Durwin Glaze on: 01/16/2019 09:26 AM   Modules accepted: Orders

## 2019-01-17 LAB — COMPREHENSIVE METABOLIC PANEL
ALT: 66 IU/L — ABNORMAL HIGH (ref 0–32)
AST: 61 IU/L — ABNORMAL HIGH (ref 0–40)
Albumin/Globulin Ratio: 1.6 (ref 1.2–2.2)
Albumin: 4.6 g/dL (ref 3.9–5.0)
Alkaline Phosphatase: 147 IU/L — ABNORMAL HIGH (ref 39–117)
BUN/Creatinine Ratio: 13 (ref 9–23)
BUN: 10 mg/dL (ref 6–20)
Bilirubin Total: 0.9 mg/dL (ref 0.0–1.2)
CO2: 22 mmol/L (ref 20–29)
Calcium: 9.4 mg/dL (ref 8.7–10.2)
Chloride: 100 mmol/L (ref 96–106)
Creatinine, Ser: 0.79 mg/dL (ref 0.57–1.00)
GFR calc Af Amer: 117 mL/min/{1.73_m2} (ref >59)
GFR calc non Af Amer: 101 mL/min/{1.73_m2} (ref >59)
Globulin, Total: 2.9 g/dL (ref 1.5–4.5)
Glucose: 105 mg/dL — ABNORMAL HIGH (ref 65–99)
Potassium: 4.2 mmol/L (ref 3.5–5.2)
Sodium: 136 mmol/L (ref 134–144)
Total Protein: 7.5 g/dL (ref 6.0–8.5)

## 2019-01-17 LAB — CBC
Hematocrit: 42.7 % (ref 34.0–46.6)
Hemoglobin: 13.9 g/dL (ref 11.1–15.9)
MCH: 28.3 pg (ref 26.6–33.0)
MCHC: 32.6 g/dL (ref 31.5–35.7)
MCV: 87 fL (ref 79–97)
Platelets: 270 10*3/uL (ref 150–450)
RBC: 4.92 x10E6/uL (ref 3.77–5.28)
RDW: 14 % (ref 11.7–15.4)
WBC: 7.3 10*3/uL (ref 3.4–10.8)

## 2019-01-19 NOTE — Addendum Note (Signed)
Addended by: Augusto Gamble on: 01/19/2019 08:14 PM   Modules accepted: Orders

## 2019-01-19 NOTE — Progress Notes (Signed)
I placed the orders.   Dr. Marcelline Mates

## 2019-01-23 ENCOUNTER — Other Ambulatory Visit: Payer: Self-pay

## 2019-01-23 ENCOUNTER — Other Ambulatory Visit: Payer: Managed Care, Other (non HMO)

## 2019-01-23 DIAGNOSIS — R7989 Other specified abnormal findings of blood chemistry: Secondary | ICD-10-CM

## 2019-01-24 LAB — HEPATITIS A ANTIBODY, IGM: Hep A IgM: NEGATIVE

## 2019-01-24 LAB — IRON,TIBC AND FERRITIN PANEL
Ferritin: 90 ng/mL (ref 15–150)
Iron Saturation: 16 % (ref 15–55)
Iron: 61 ug/dL (ref 27–159)
Total Iron Binding Capacity: 376 ug/dL (ref 250–450)
UIBC: 315 ug/dL (ref 131–425)

## 2019-01-24 LAB — HEPATITIS B SURFACE ANTIGEN: Hepatitis B Surface Ag: NEGATIVE

## 2019-01-24 LAB — CMV ABS, IGG+IGM (CYTOMEGALOVIRUS)
CMV Ab - IgG: 1.5 U/mL — ABNORMAL HIGH (ref 0.00–0.59)
CMV IgM Ser EIA-aCnc: <30 AU/mL (ref 0.0–29.9)

## 2019-01-24 LAB — EPSTEIN-BARR VIRUS VCA, IGM: EBV VCA IgM: <36 U/mL (ref 0.0–35.9)

## 2019-01-24 LAB — HEPATITIS C ANTIBODY: Hep C Virus Ab: <0.1 s/co ratio (ref 0.0–0.9)

## 2019-01-24 LAB — HEPATITIS B SURFACE ANTIBODY,QUALITATIVE: Hep B Surface Ab, Qual: REACTIVE

## 2019-01-24 LAB — EPSTEIN-BARR VIRUS NUCLEAR ANTIGEN ANTIBODY, IGG: EBV NA IgG: >600 U/mL — ABNORMAL HIGH (ref 0.0–17.9)

## 2019-01-28 ENCOUNTER — Other Ambulatory Visit: Payer: Self-pay

## 2019-01-28 DIAGNOSIS — R748 Abnormal levels of other serum enzymes: Secondary | ICD-10-CM

## 2019-02-26 ENCOUNTER — Ambulatory Visit: Payer: BLUE CROSS/BLUE SHIELD | Admitting: Gastroenterology

## 2019-03-19 ENCOUNTER — Ambulatory Visit: Payer: BLUE CROSS/BLUE SHIELD | Admitting: Gastroenterology

## 2019-05-19 ENCOUNTER — Ambulatory Visit: Payer: Managed Care, Other (non HMO) | Admitting: Gastroenterology

## 2019-06-08 ENCOUNTER — Ambulatory Visit (INDEPENDENT_AMBULATORY_CARE_PROVIDER_SITE_OTHER): Payer: Managed Care, Other (non HMO) | Admitting: Gastroenterology

## 2019-06-08 ENCOUNTER — Other Ambulatory Visit
Admission: RE | Admit: 2019-06-08 | Discharge: 2019-06-08 | Disposition: A | Discharge status: Home / Self Care | Payer: Managed Care, Other (non HMO) | Attending: Gastroenterology | Admitting: Gastroenterology

## 2019-06-08 ENCOUNTER — Encounter: Payer: Self-pay | Admitting: Gastroenterology

## 2019-06-08 ENCOUNTER — Other Ambulatory Visit: Payer: Self-pay

## 2019-06-08 VITALS — BP 131/92 | HR 84 | Temp 97.5°F | Ht 62.0 in | Wt 210.0 lb

## 2019-06-08 DIAGNOSIS — R748 Abnormal levels of other serum enzymes: Secondary | ICD-10-CM

## 2019-06-08 LAB — HEPATIC FUNCTION PANEL
ALT: 52 U/L — ABNORMAL HIGH (ref 0–44)
AST: 46 U/L — ABNORMAL HIGH (ref 15–41)
Albumin: 4.3 g/dL (ref 3.5–5.0)
Alkaline Phosphatase: 95 U/L (ref 38–126)
Bilirubin, Direct: 0.1 mg/dL (ref 0.0–0.2)
Indirect Bilirubin: 1 mg/dL — ABNORMAL HIGH (ref 0.3–0.9)
Total Bilirubin: 1.1 mg/dL (ref 0.3–1.2)
Total Protein: 8.4 g/dL — ABNORMAL HIGH (ref 6.5–8.1)

## 2019-06-08 LAB — IRON AND TIBC
Iron: 64 ug/dL (ref 28–170)
Saturation Ratios: 14 % (ref 10.4–31.8)
TIBC: 444 ug/dL (ref 250–450)
UIBC: 380 ug/dL

## 2019-06-08 LAB — HEPATITIS A ANTIBODY, TOTAL: hep A Total Ab: REACTIVE — AB

## 2019-06-08 LAB — FERRITIN: Ferritin: 59 ng/mL (ref 11–307)

## 2019-06-08 NOTE — Progress Notes (Signed)
Gastroenterology Consultation  Referring Provider:     Hildred Laser, MD Primary Care Physician:  Nira Retort Primary Gastroenterologist:  Dr. Servando Snare     Reason for Consultation:     Abnormal liver enzymes        HPI:   Cassie Garza is a 29 y.o. y/o female referred for consultation & management of abnormal liver enzymes by Dr. Mart Piggs, Gavin Potters.  This patient comes in today after being found to have abnormal liver enzymes.  The patient was following up with her OB doctor and had labs drawn which showed her to have chronically elevated liver enzymes.  The patient did have blood work showing her to have a past infection with Epstein-Barr virus and no acute hepatitis A with a negative hepatitis B surface antibody and antigen.  The patient's iron studies and CBC were also normal.  Her liver enzymes showed:  Component     Latest Ref Rng & Units 11/07/2018 01/16/2019  Total Bilirubin     0.0 - 1.2 mg/dL 0.4 0.9  Alkaline Phosphatase     39 - 117 IU/L 121 (H) 147 (H)  AST     0 - 40 IU/L 36 61 (H)  ALT     0 - 32 IU/L 56 (H) 66 (H)   There does not seem to be any labs prior to that in her chart nor are there any labs after that. The patient denies any alcohol abuse or hepatotoxic medications.  She states that she has been in her usual state of health except that she has gained weight due to the pandemic and not being active.  There is no report of any family history of any liver problems.  Past Medical History:  Diagnosis Date  . Chlamydia infection affecting pregnancy 07/15/2017   - Treated 4/1 [ ]  needs TOC early May    Past Surgical History:  Procedure Laterality Date  . CERVICAL CERCLAGE N/A 07/29/2017   Procedure: CERCLAGE CERVICAL;  Surgeon: 07/31/2017, MD;  Location: [facility] ORS;  Service: Gynecology;  Laterality: N/A;  . NO PAST SURGERIES      Prior to Admission medications   Not on File    Family History  Problem Relation Age of Onset  .  Cancer Brother 7       LEUKEMIA  . Diabetes Maternal Grandmother   . Hypertension Maternal Grandmother      Social History   Tobacco Use  . Smoking status: Never Smoker  . Smokeless tobacco: Never Used  Substance Use Topics  . Alcohol use: Yes  . Drug use: Never    Allergies as of 06/08/2019  . (No Known Allergies)    Review of Systems:    All systems reviewed and negative except where noted in HPI.   Physical Exam:  There were no vitals taken for this visit. No LMP recorded. General:   Alert,  Well-developed, well-nourished, pleasant and cooperative in NAD Head:  Normocephalic and atraumatic. Eyes:  Sclera clear, no icterus.   Conjunctiva pink. Ears:  Normal auditory acuity. Neck:  Supple; no masses or thyromegaly. Lungs:  Respirations even and unlabored.  Clear throughout to auscultation.   No wheezes, crackles, or rhonchi. No acute distress. Heart:  Regular rate and rhythm; no murmurs, clicks, rubs, or gallops. Abdomen:  Normal bowel sounds.  No bruits.  Soft, non-tender and non-distended without masses, hepatosplenomegaly or hernias noted.  No guarding or rebound tenderness.  Negative Carnett sign.   Rectal:  Deferred.  Pulses:  Normal pulses noted. Extremities:  No clubbing or edema.  No cyanosis. Neurologic:  Alert and oriented x3;  grossly normal neurologically. Skin:  Intact without significant lesions or rashes.  No jaundice. Lymph Nodes:  No significant cervical adenopathy. Psych:  Alert and cooperative. Normal mood and affect.  Imaging Studies: No results found.  Assessment and Plan:   Cassie Garza is a 29 y.o. y/o female who comes in with abnormal liver enzymes.  The patient's last liver enzymes were in October of last year.  The patient will have her liver enzymes checked again and will be set up for an ultrasound of the liver to rule out fatty liver disease.  She will also have blood work sent off for other possible cause of abnormal liver  enzymes.  The patient has been explained the possibilities of her abnormal liver enzymes and she will be contacted with the results of the labs and the ultrasound.  Will also be tested for immunity to hepatitis A and B and be vaccinated accordingly.  The patient has been explained the plan and agrees with it.    Lucilla Lame, MD. Marval Regal    Note: This dictation was prepared with Dragon dictation along with smaller phrase technology. Any transcriptional errors that result from this process are unintentional.

## 2019-06-09 LAB — ANA: Anti Nuclear Antibody (ANA): POSITIVE — AB

## 2019-06-09 LAB — ALPHA-1-ANTITRYPSIN: A-1 Antitrypsin, Ser: 143 mg/dL (ref 100–188)

## 2019-06-09 LAB — ANTI-SMOOTH MUSCLE ANTIBODY, IGG: F-Actin IgG: 6 Units (ref 0–19)

## 2019-06-09 LAB — CERULOPLASMIN: Ceruloplasmin: 31.4 mg/dL (ref 19.0–39.0)

## 2019-06-09 LAB — MITOCHONDRIAL ANTIBODIES: Mitochondrial M2 Ab, IgG: <20 Units (ref 0.0–20.0)

## 2019-06-10 ENCOUNTER — Ambulatory Visit: Payer: Managed Care, Other (non HMO)

## 2019-06-10 LAB — ALKALINE PHOSPHATASE, ISOENZYMES
Alk Phos Bone Fract: 41 % (ref 14–68)
Alk Phos Liver Fract: 55 % (ref 18–85)
Alk Phos: 118 IU/L — ABNORMAL HIGH (ref 39–117)
Intestinal %: 4 % (ref 0–18)

## 2019-06-11 ENCOUNTER — Other Ambulatory Visit: Payer: Self-pay

## 2019-06-11 ENCOUNTER — Telehealth: Payer: Self-pay

## 2019-06-11 ENCOUNTER — Ambulatory Visit
Admission: RE | Admit: 2019-06-11 | Discharge: 2019-06-11 | Disposition: A | Discharge status: Home / Self Care | Payer: Managed Care, Other (non HMO) | Source: Ambulatory Visit | Attending: Gastroenterology | Admitting: Gastroenterology

## 2019-06-11 DIAGNOSIS — R748 Abnormal levels of other serum enzymes: Secondary | ICD-10-CM | POA: Insufficient documentation

## 2019-06-11 NOTE — Telephone Encounter (Signed)
Pt notified of results via mychart.  

## 2019-06-11 NOTE — Telephone Encounter (Signed)
-----   Message from Midge Minium, MD sent at 06/11/2019  1:44 PM EST ----- Let the patient know that she has some gallstones that do not look like they are causing any problems.  The patient's liver ultrasound did not show extensive fatty infiltrate consistent with fatty liver disease.  There is no focal mass or cancer seen.  The patient should have her labs checked again after losing weight.

## 2019-06-11 NOTE — Telephone Encounter (Signed)
-----   Message from Midge Minium, MD sent at 06/11/2019  9:36 AM EST ----- The patient know that her liver enzymes have come down somewhat and she should continue to try and decrease her weight with a repeat blood test for liver enzymes in 3 months.

## 2019-06-12 ENCOUNTER — Ambulatory Visit: Payer: Managed Care, Other (non HMO) | Admitting: Gastroenterology

## 2021-11-30 ENCOUNTER — Ambulatory Visit (LOCAL_COMMUNITY_HEALTH_CENTER): Payer: Self-pay

## 2021-11-30 VITALS — BP 120/72 | Ht 63.0 in | Wt 168.5 lb

## 2021-11-30 DIAGNOSIS — Z3201 Encounter for pregnancy test, result positive: Secondary | ICD-10-CM

## 2021-11-30 LAB — PREGNANCY, URINE: Preg Test, Ur: POSITIVE — AB

## 2021-11-30 MED ORDER — PRENATAL VITAMINS 28-0.8 MG PO TABS: 28.0000 mg | ORAL_TABLET | Freq: Every day | ORAL | 0 refills | Status: AC

## 2021-11-30 NOTE — Progress Notes (Signed)
UPT positive.  Pregnancy packet given and reviewed information with pt.  See pregnancy test flow sheet.  Plans prenatal care at [facility] where received care in the past.    The patient was dispensed prenatal vitamins today. I provided counseling today regarding the medication. We discussed the medication, the side effects and when to call clinic. Patient given the opportunity to ask questions. Questions answered.   Cherlynn Polo, RN

## 2022-01-18 ENCOUNTER — Ambulatory Visit (INDEPENDENT_AMBULATORY_CARE_PROVIDER_SITE_OTHER): Payer: Managed Care, Other (non HMO)

## 2022-01-18 DIAGNOSIS — Z369 Encounter for antenatal screening, unspecified: Secondary | ICD-10-CM

## 2022-01-18 DIAGNOSIS — Z3480 Encounter for supervision of other normal pregnancy, unspecified trimester: Secondary | ICD-10-CM

## 2022-01-18 DIAGNOSIS — Z3689 Encounter for other specified antenatal screening: Secondary | ICD-10-CM

## 2022-01-18 NOTE — Progress Notes (Signed)
New OB Intake  I connected with  Cassie Garza on 01/18/22 at  8:15 AM EDT by telephone and verified that I am speaking with the correct person using two identifiers. 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  I explained I am completing New OB Intake today. We discussed her EDD of 06/25/2022 that is based on LMP of 09/18/2021. Pt is G2/P1001. I reviewed her allergies, medications, Medical/Surgical/OB history, and appropriate screenings. Based on history, this is a/an pregnancy uncomplicated.  Patient Active Problem List   Diagnosis Date Noted   Chronic idiopathic constipation 07/29/2017   BMI 30.0-30.9,adult 07/15/2017    Concerns addressed today None  Delivery Plans:  Plans to deliver at [facility].  Anatomy US Explained first scheduled [place] will be around 20 weeks.   Labs Discussed genetic screening with patient. Patient desires genetic testing to be drawn with new OB labs. Discussed possible labs to be drawn at new OB appointment.  COVID Vaccine Patient has not had COVID vaccine.   Social Determinants of Health Food Insecurity: denies food insecurity Transportation: Patient denies transportation needs. Childcare: Discussed no children allowed at ultrasound appointments.   First visit review I reviewed new OB appt with pt. I explained she will have ob bloodwork and pap smear/pelvic exam if indicated. Explained pt will be seen by Dr. Rubie Maid at first visit; encounter routed to appropriate provider.   Cassie Garza, [facility] 01/18/2022  8:33 AM

## 2022-01-22 ENCOUNTER — Other Ambulatory Visit: Payer: BC Managed Care – PPO

## 2022-01-22 DIAGNOSIS — Z113 Encounter for screening for infections with a predominantly sexual mode of transmission: Secondary | ICD-10-CM

## 2022-01-22 DIAGNOSIS — Z369 Encounter for antenatal screening, unspecified: Secondary | ICD-10-CM

## 2022-01-22 DIAGNOSIS — Z3480 Encounter for supervision of other normal pregnancy, unspecified trimester: Secondary | ICD-10-CM | POA: Diagnosis not present

## 2022-01-22 DIAGNOSIS — Z0283 Encounter for blood-alcohol and blood-drug test: Secondary | ICD-10-CM

## 2022-01-22 DIAGNOSIS — Z3A18 18 weeks gestation of pregnancy: Secondary | ICD-10-CM

## 2022-01-23 DIAGNOSIS — Z113 Encounter for screening for infections with a predominantly sexual mode of transmission: Secondary | ICD-10-CM | POA: Diagnosis not present

## 2022-01-23 DIAGNOSIS — Z3A18 18 weeks gestation of pregnancy: Secondary | ICD-10-CM | POA: Diagnosis not present

## 2022-01-23 DIAGNOSIS — Z0283 Encounter for blood-alcohol and blood-drug test: Secondary | ICD-10-CM | POA: Diagnosis not present

## 2022-01-23 DIAGNOSIS — Z3480 Encounter for supervision of other normal pregnancy, unspecified trimester: Secondary | ICD-10-CM | POA: Diagnosis not present

## 2022-01-23 LAB — CBC/D/PLT+RPR+RH+ABO+RUBIGG...
Antibody Screen: NEGATIVE
Basophils Absolute: 0 10*3/uL (ref 0.0–0.2)
Basos: 0 %
EOS (ABSOLUTE): 0.3 10*3/uL (ref 0.0–0.4)
Eos: 5 %
HCV Ab: NONREACTIVE
HIV Screen 4th Generation wRfx: NONREACTIVE
Hematocrit: 34.8 % (ref 34.0–46.6)
Hemoglobin: 12 g/dL (ref 11.1–15.9)
Hepatitis B Surface Ag: NEGATIVE
Immature Grans (Abs): 0 10*3/uL (ref 0.0–0.1)
Immature Granulocytes: 0 %
Lymphocytes Absolute: 2.2 10*3/uL (ref 0.7–3.1)
Lymphs: 30 %
MCH: 30.9 pg (ref 26.6–33.0)
MCHC: 34.5 g/dL (ref 31.5–35.7)
MCV: 90 fL (ref 79–97)
Monocytes Absolute: 0.5 10*3/uL (ref 0.1–0.9)
Monocytes: 7 %
Neutrophils Absolute: 4.2 10*3/uL (ref 1.4–7.0)
Neutrophils: 58 %
Platelets: 233 10*3/uL (ref 150–450)
RBC: 3.88 x10E6/uL (ref 3.77–5.28)
RDW: 14.1 % (ref 11.7–15.4)
RPR Ser Ql: NONREACTIVE
Rh Factor: POSITIVE
Rubella Antibodies, IGG: 6.43 index (ref >0.99)
Varicella zoster IgG: 897 index (ref >165)
WBC: 7.3 10*3/uL (ref 3.4–10.8)

## 2022-01-23 LAB — HCV INTERPRETATION

## 2022-01-24 LAB — URINALYSIS, ROUTINE W REFLEX MICROSCOPIC
Bilirubin, UA: NEGATIVE
Glucose, UA: NEGATIVE
Ketones, UA: NEGATIVE
Leukocytes,UA: NEGATIVE
Nitrite, UA: NEGATIVE
RBC, UA: NEGATIVE
Specific Gravity, UA: 1.022 (ref 1.005–1.030)
Urobilinogen, Ur: 0.2 mg/dL (ref 0.2–1.0)
pH, UA: 8 — ABNORMAL HIGH (ref 5.0–7.5)

## 2022-01-25 LAB — URINE CULTURE, OB REFLEX

## 2022-01-25 LAB — GC/CHLAMYDIA PROBE AMP
Chlamydia trachomatis, NAA: NEGATIVE
Neisseria Gonorrhoeae by PCR: NEGATIVE

## 2022-01-25 LAB — CULTURE, OB URINE

## 2022-01-27 LAB — PAIN MGT SCRN (14 DRUGS), UR

## 2022-01-28 LAB — MATERNIT 21 PLUS CORE, BLOOD
Fetal Fraction: 3
Result (T21): NEGATIVE
Trisomy 13 (Patau syndrome): NEGATIVE
Trisomy 18 (Edwards syndrome): NEGATIVE
Trisomy 21 (Down syndrome): NEGATIVE

## 2022-02-01 ENCOUNTER — Ambulatory Visit (INDEPENDENT_AMBULATORY_CARE_PROVIDER_SITE_OTHER): Payer: BC Managed Care – PPO | Admitting: Obstetrics and Gynecology

## 2022-02-01 ENCOUNTER — Encounter: Payer: Self-pay | Admitting: Obstetrics and Gynecology

## 2022-02-01 ENCOUNTER — Other Ambulatory Visit (HOSPITAL_COMMUNITY)
Admission: RE | Admit: 2022-02-01 | Discharge: 2022-02-01 | Disposition: A | Discharge status: Home / Self Care | Payer: BC Managed Care – PPO | Source: Ambulatory Visit | Attending: Obstetrics and Gynecology | Admitting: Obstetrics and Gynecology

## 2022-02-01 VITALS — BP 110/63 | HR 81 | Ht 63.0 in | Wt 175.9 lb

## 2022-02-01 DIAGNOSIS — Z124 Encounter for screening for malignant neoplasm of cervix: Secondary | ICD-10-CM

## 2022-02-01 DIAGNOSIS — Z3480 Encounter for supervision of other normal pregnancy, unspecified trimester: Secondary | ICD-10-CM | POA: Insufficient documentation

## 2022-02-01 DIAGNOSIS — Z3A19 19 weeks gestation of pregnancy: Secondary | ICD-10-CM

## 2022-02-01 DIAGNOSIS — O09292 Supervision of pregnancy with other poor reproductive or obstetric history, second trimester: Secondary | ICD-10-CM

## 2022-02-01 DIAGNOSIS — Z9889 Other specified postprocedural states: Secondary | ICD-10-CM

## 2022-02-01 LAB — POCT URINALYSIS DIPSTICK OB
Bilirubin, UA: NEGATIVE
Blood, UA: NEGATIVE
Glucose, UA: NEGATIVE
Ketones, UA: NEGATIVE
Leukocytes, UA: NEGATIVE
Nitrite, UA: NEGATIVE
Spec Grav, UA: 1.01 (ref 1.010–1.025)
Urobilinogen, UA: 0.2 E.U./dL
pH, UA: 8.5 — AB (ref 5.0–8.0)

## 2022-02-01 NOTE — Progress Notes (Signed)
OBSTETRIC INITIAL PRENATAL VISIT  Subjective:    Cassie Garza is being seen today for her first obstetrical visit.  This is a planned pregnancy. She is a 31 y.o. G2P1001 female at [redacted]w[redacted]d gestation, Estimated Date of Delivery: 06/25/22 with Patient's last menstrual period was 09/18/2021 (exact date). Her obstetrical history is significant for  mild obesity, cervical cerclage placement at 16 weeks in last pregnancy due to mild cervical incompetency . Relationship with FOB: significant other, living together. Patient does intend to breast feed. Pregnancy history fully reviewed.    OB History  Gravida Para Term Preterm AB Living  2 1 1  0 0 1  SAB IAB Ectopic Multiple Live Births  0 0 0 0 1    # Outcome Date GA Lbr Len/2nd Weight Sex Delivery Anes PTL Lv  2 Current           1 Term 01/07/18 [redacted]w[redacted]d / 00:43 7 lb 4.8 oz (3.31 kg) M Vag-Spont Local  LIV     Name: BRAMBILA MORALES,BOY Johnn[place]     Apgar1: 8  Apgar5: 9    Obstetric Comments  History of rescue cerclage in 2nd trimester first pregnancy    Gynecologic History:  Last pap smear was 02/01/2022.  Results were Normal.  Denies h/o abnormal pap smears in the past.  Reports history of STIs, Chlamydia diagnosed 2019.     Past Medical History:  Diagnosis Date   Chlamydia infection affecting pregnancy 07/15/2017   - Treated 4/1 [ ]  needs TOC early May    Family History  Problem Relation Age of Onset   Cancer Brother 7       LEUKEMIA   Diabetes Maternal Grandmother    Hypertension Maternal Grandmother    Diabetes Paternal Grandmother     Past Surgical History:  Procedure Laterality Date   CERVICAL CERCLAGE N/A 07/29/2017   Procedure: CERCLAGE CERVICAL;  Surgeon: June, MD;  Location: [facility] ORS;  Service: Gynecology;  Laterality: N/A;    Social History   Socioeconomic History   Marital status: Single    Spouse name: Not on file   Number of children: 1   Years of education: 13   Highest  education level: Not on file  Occupational History   Occupation: [employer]    Comment: [employer]  Tobacco Use   Smoking status: Former    Types: Cigarettes    Passive exposure: Past (father smoked; years ago)   Smokeless tobacco: Never   Tobacco comments:    Smoked cigarettes occasionally; last use 2017  Vaping Use   Vaping Use: Never used  Substance and Sexual Activity   Alcohol use: Not Currently    Comment: occasional use; Last use one month ago   Drug use: Never   Sexual activity: Yes    Partners: Male    Birth control/protection: None    Comment: hx condoms  Other Topics Concern   Not on file  Social History Narrative   Not on file   Social Determinants of Health   Financial Resource Strain: Low Risk  (01/18/2022)   Overall Financial Resource Strain (CARDIA)    Difficulty of Paying Living Expenses: Not very hard  Food Insecurity: No Food Insecurity (01/18/2022)   Hunger Vital Sign    Worried About Running Out of Food in the Last Year: Never true    Ran Out of Food in the Last Year: Never true  Transportation Needs: No Transportation Needs (01/18/2022)   PRAPARE - Transportation  Lack of Transportation (Medical): No    Lack of Transportation (Non-Medical): No  Physical Activity: Insufficiently Active (01/18/2022)   Exercise Vital Sign    Days of Exercise per Week: 2 days    Minutes of Exercise per Session: 40 min  Stress: No Stress Concern Present (01/18/2022)   [place]    Feeling of Stress : Not at all  Social Connections: Unknown (01/18/2022)   Social Connection and Isolation Panel [NHANES]    Frequency of Communication with Friends and Family: More than three times a week    Frequency of Social Gatherings with Friends and Family: Twice a week    Attends Religious Services: More than 4 times per year    Active Member of [employer] or Organizations: No    Attends Archivist Meetings:  Never    Marital Status: Not on file  Intimate Partner Violence: Not At Risk (01/18/2022)   Humiliation, Afraid, Rape, and Kick questionnaire    Fear of Current or Ex-Partner: No    Emotionally Abused: No    Physically Abused: No    Sexually Abused: No    Current Outpatient Medications on File Prior to Visit  Medication Sig Dispense Refill   Prenatal Vit-Fe Fumarate-FA (PRENATAL VITAMINS) 28-0.8 MG TABS Take 28 mg by mouth daily. 100 tablet 0   No current facility-administered medications on file prior to visit.    No Known Allergies   Review of Systems General: Not Present- Fever, Weight Loss and Weight Gain. Skin: Not Present- Rash. HEENT: Not Present- Blurred Vision, Headache and Bleeding Gums. Respiratory: Not Present- Difficulty Breathing. Breast: Not Present- Breast Mass. Cardiovascular: Not Present- Chest Pain, Elevated Blood Pressure, Fainting / Blacking Out and Shortness of Breath. Gastrointestinal: Not Present- Abdominal Pain, Constipation, Nausea and Vomiting. Female Genitourinary: Not Present- Frequency, Painful Urination, Pelvic Pain, Vaginal Bleeding, Vaginal Discharge, Contractions, regular, Fetal Movements Decreased, Urinary Complaints and Vaginal Fluid. Musculoskeletal: Not Present- Back Pain and Leg Cramps. Neurological: Not Present- Dizziness. Psychiatric: Not Present- Depression.     Objective:   .Blood pressure 110/63, pulse 81, weight 175 lb 14.4 oz (79.8 kg), last menstrual period 09/18/2021, currently breastfeeding.   Body mass index is 31.16 kg/m.  General Appearance:    Alert, cooperative, no distress, appears stated age, mild obesity  Head:    Normocephalic, without obvious abnormality, atraumatic  Eyes:    PERRL, conjunctiva/corneas clear, EOM's intact, both eyes  Ears:    Normal external ear canals, both ears  Nose:   Nares normal, septum midline, mucosa normal, no drainage or sinus tenderness  Throat:   Lips, mucosa, and tongue normal; teeth  and gums normal  Neck:   Supple, symmetrical, trachea midline, no adenopathy; thyroid: no enlargement/tenderness/nodules; no carotid bruit or JVD  Back:     Symmetric, no curvature, ROM normal, no CVA tenderness  Lungs:     Clear to auscultation bilaterally, respirations unlabored  Chest Wall:    No tenderness or deformity   Heart:    Regular rate and rhythm, S1 and S2 normal, no murmur, rub or gallop  Breast Exam:    No tenderness, masses, or nipple abnormality  Abdomen:     Soft, non-tender, bowel sounds active all four quadrants, no masses, no organomegaly.  FHT 143 bpm.  Genitalia:    Pelvic:external genitalia normal, vagina without lesions, discharge, or tenderness, rectovaginal septum  normal. Cervix normal in appearance, no cervical motion tenderness, no adnexal masses or tenderness.  Pregnancy positive findings: uterine enlargement: 20 wk size, nontender.   Rectal:    Normal external sphincter.  No hemorrhoids appreciated. Internal exam not done.   Extremities:   Extremities normal, atraumatic, no cyanosis or edema  Pulses:   2+ and symmetric all extremities  Skin:   Skin color, texture, turgor normal, no rashes or lesions  Lymph nodes:   Cervical, supraclavicular, and axillary nodes normal  Neurologic:   CNII-XII intact, normal strength, sensation and reflexes throughout     Assessment:   1. Supervision of other normal pregnancy, antepartum   2. [redacted] weeks gestation of pregnancy   3. Cervical cancer screening   4. History of cerclage, currently pregnant, second trimester     Plan:   Supervision of high risk pregnancy  - Initial labs reviewed. - Prenatal vitamins encouraged. - Problem list reviewed and updated. - New OB counseling:  The patient has been given an overview regarding routine prenatal care.  Recommendations regarding diet, weight gain, and exercise in pregnancy were given. - Prenatal testing, optional genetic testing, and ultrasound use in pregnancy were reviewed.   Traditional genetic screening vs cell-fee DNA genetic screening discussed, including risks and benefits. Testing results reviewed, normal. Patient planning gender reveal.  - Benefits of Breast Feeding were discussed. The patient is encouraged to consider nursing her baby post partum. The patient has Medicaid.  [facility] Medicaid Risk Screening Form completed today   2. [redacted] weeks gestation of pregnancy - POC Urinalysis Dipstick OB - Anatomy scan ordered.   3. Cervical cancer screening - Cytology - PAP   4. History of cervical cerclage, currently pregnant second trimester - Reviewed history, cervical cerclage placed as rescue, patient was noted to be 1 cm dilated at [redacted] weeks gestation, with spotting. Discussed plan for anatomy scan with cervical assessment.  Will determine if repeat cerclage is needed.  No obvious cervical dilation at this time.   Follow up in 4 weeks.    Hildred Laser, MD [facility]

## 2022-02-02 ENCOUNTER — Encounter: Payer: Self-pay | Admitting: Obstetrics and Gynecology

## 2022-02-07 LAB — CYTOLOGY - PAP
Comment: NEGATIVE
Diagnosis: NEGATIVE
High risk HPV: NEGATIVE

## 2022-02-08 ENCOUNTER — Other Ambulatory Visit: Payer: Managed Care, Other (non HMO)

## 2022-02-08 ENCOUNTER — Ambulatory Visit (INDEPENDENT_AMBULATORY_CARE_PROVIDER_SITE_OTHER): Payer: BC Managed Care – PPO

## 2022-02-08 DIAGNOSIS — Z3480 Encounter for supervision of other normal pregnancy, unspecified trimester: Secondary | ICD-10-CM

## 2022-02-08 DIAGNOSIS — Z3A20 20 weeks gestation of pregnancy: Secondary | ICD-10-CM

## 2022-02-08 DIAGNOSIS — Z3687 Encounter for antenatal screening for uncertain dates: Secondary | ICD-10-CM | POA: Diagnosis not present

## 2022-02-08 DIAGNOSIS — Z369 Encounter for antenatal screening, unspecified: Secondary | ICD-10-CM

## 2022-03-01 ENCOUNTER — Ambulatory Visit: Payer: Managed Care, Other (non HMO) | Admitting: Obstetrics and Gynecology

## 2022-03-01 VITALS — BP 112/72 | HR 72 | Wt 176.9 lb

## 2022-03-01 DIAGNOSIS — Z3482 Encounter for supervision of other normal pregnancy, second trimester: Secondary | ICD-10-CM

## 2022-03-01 DIAGNOSIS — Z3A23 23 weeks gestation of pregnancy: Secondary | ICD-10-CM

## 2022-03-01 LAB — POCT URINALYSIS DIPSTICK OB
Bilirubin, UA: NEGATIVE
Blood, UA: NEGATIVE
Glucose, UA: NEGATIVE
Ketones, UA: NEGATIVE
Leukocytes, UA: NEGATIVE
Nitrite, UA: NEGATIVE
POC,PROTEIN,UA: NEGATIVE
Spec Grav, UA: 1.015 (ref 1.010–1.025)
Urobilinogen, UA: 0.2 E.U./dL
pH, UA: 6 (ref 5.0–8.0)

## 2022-03-01 NOTE — Progress Notes (Signed)
ROB: Reports no problems.  Feels well.  Denies contractions.  Reports daily fetal movement.  Ultrasound for anatomy was normal with a closed cervix.  1 hour GCT next visit.

## 2022-03-30 ENCOUNTER — Ambulatory Visit (INDEPENDENT_AMBULATORY_CARE_PROVIDER_SITE_OTHER): Payer: BC Managed Care – PPO | Admitting: Obstetrics and Gynecology

## 2022-03-30 ENCOUNTER — Other Ambulatory Visit: Payer: BC Managed Care – PPO

## 2022-03-30 ENCOUNTER — Encounter: Payer: Self-pay | Admitting: Obstetrics and Gynecology

## 2022-03-30 VITALS — BP 102/62 | HR 83 | Wt 178.9 lb

## 2022-03-30 DIAGNOSIS — Z3A27 27 weeks gestation of pregnancy: Secondary | ICD-10-CM | POA: Diagnosis not present

## 2022-03-30 DIAGNOSIS — Z23 Encounter for immunization: Secondary | ICD-10-CM

## 2022-03-30 DIAGNOSIS — Z131 Encounter for screening for diabetes mellitus: Secondary | ICD-10-CM

## 2022-03-30 DIAGNOSIS — Z113 Encounter for screening for infections with a predominantly sexual mode of transmission: Secondary | ICD-10-CM

## 2022-03-30 DIAGNOSIS — Z3482 Encounter for supervision of other normal pregnancy, second trimester: Secondary | ICD-10-CM

## 2022-03-30 DIAGNOSIS — Z130 Encounter for screening for diseases of the blood and blood-forming organs and certain disorders involving the immune mechanism: Secondary | ICD-10-CM

## 2022-03-30 LAB — POCT URINALYSIS DIPSTICK OB
Bilirubin, UA: NEGATIVE
Blood, UA: NEGATIVE
Glucose, UA: NEGATIVE
Ketones, UA: NEGATIVE
Leukocytes, UA: NEGATIVE
Nitrite, UA: NEGATIVE
Urobilinogen, UA: 0.2 E.U./dL
pH, UA: 8.5 — AB (ref 5.0–8.0)

## 2022-03-30 NOTE — Progress Notes (Signed)
ROB: Cassie Garza is a 31 y.o. G2P1001 at [redacted]w[redacted]d. Patient doing well, no issues.  For 28 week labs today.  Plans to  plans to breastfeed, desires  unsure method  for contraception, unsure if she desires pregnancy again soon after this one. For Tdap today, signed blood consent.  Reviewed pain management in labor, did not get epidural last pregnancy, however is open to consider during this pregnancy. RTC in 2-3 weeks. Declined flu vaccine.

## 2022-03-30 NOTE — Progress Notes (Signed)
ROB [redacted]w[redacted]d: She is doing well, has good fetal movement. No new concerns today. She had her TDAP/BTC today.

## 2022-03-30 NOTE — Patient Instructions (Signed)
Tdap (Tetanus, Diphtheria, Pertussis) Vaccine: What You Need to Know 1. Why get vaccinated? Tdap vaccine can prevent tetanus, diphtheria, and pertussis. Diphtheria and pertussis spread from person to person. Tetanus enters the body through cuts or wounds. TETANUS (T) causes painful stiffening of the muscles. Tetanus can lead to serious health problems, including being unable to open the mouth, having trouble swallowing and breathing, or death. DIPHTHERIA (D) can lead to difficulty breathing, heart failure, paralysis, or death. PERTUSSIS (aP), also known as "whooping cough," can cause uncontrollable, violent coughing that makes it hard to breathe, eat, or drink. Pertussis can be extremely serious especially in babies and young children, causing pneumonia, convulsions, brain damage, or death. In teens and adults, it can cause weight loss, loss of bladder control, passing out, and rib fractures from severe coughing. 2. Tdap vaccine Tdap is only for children 7 years and older, adolescents, and adults.  Adolescents should receive a single dose of Tdap, preferably at age 10 or 71 years. Pregnant people should get a dose of Tdap during every pregnancy, preferably during the early part of the third trimester, to help protect the newborn from pertussis. Infants are most at risk for severe, life-threatening complications from pertussis. Adults who have never received Tdap should get a dose of Tdap. Also, adults should receive a booster dose of either Tdap or Td (a different vaccine that protects against tetanus and diphtheria but not pertussis) every 10 years, or after 5 years in the case of a severe or dirty wound or burn. Tdap may be given at the same time as other vaccines. 3. Talk with your health care provider Tell your vaccine provider if the person getting the vaccine: Has had an allergic reaction after a previous dose of any vaccine that protects against tetanus, diphtheria, or pertussis, or has any  severe, life-threatening allergies Has had a coma, decreased level of consciousness, or prolonged seizures within 7 days after a previous dose of any pertussis vaccine (DTP, DTaP, or Tdap) Has seizures or another nervous system problem Has ever had Guillain-Barr Syndrome (also called "GBS") Has had severe pain or swelling after a previous dose of any vaccine that protects against tetanus or diphtheria In some cases, your health care provider may decide to postpone Tdap vaccination until a future visit. People with minor illnesses, such as a cold, may be vaccinated. People who are moderately or severely ill should usually wait until they recover before getting Tdap vaccine.  Your health care provider can give you more information. 4. Risks of a vaccine reaction Pain, redness, or swelling where the shot was given, mild fever, headache, feeling tired, and nausea, vomiting, diarrhea, or stomachache sometimes happen after Tdap vaccination. People sometimes faint after medical procedures, including vaccination. Tell your provider if you feel dizzy or have vision changes or ringing in the ears.  As with any medicine, there is a very remote chance of a vaccine causing a severe allergic reaction, other serious injury, or death. 5. What if there is a serious problem? An allergic reaction could occur after the vaccinated person leaves the clinic. If you see signs of a severe allergic reaction (hives, swelling of the face and throat, difficulty breathing, a fast heartbeat, dizziness, or weakness), call 9-1-1 and get the person to the nearest hospital. For other signs that concern you, call your health care provider.  Adverse reactions should be reported to the Vaccine Adverse Event Reporting System (VAERS). Your health care provider will usually file this report, or you  can do it yourself. Visit the VAERS website at www.vaers.hhs.gov or call 1-800-822-7967. VAERS is only for reporting reactions, and VAERS staff  members do not give medical advice. 6. The National Vaccine Injury Compensation Program The National Vaccine Injury Compensation Program (VICP) is a federal program that was created to compensate people who may have been injured by certain vaccines. Claims regarding alleged injury or death due to vaccination have a time limit for filing, which may be as short as two years. Visit the VICP website at www.hrsa.gov/vaccinecompensation or call 1-800-338-2382 to learn about the program and about filing a claim. 7. How can I learn more? Ask your health care provider. Call your local or state health department. Visit the website of the Food and Drug Administration (FDA) for vaccine package inserts and additional information at www.fda.gov/vaccines-blood-biologics/vaccines. Contact the Centers for Disease Control and Prevention (CDC): Call 1-800-232-4636 (1-800-CDC-INFO) or Visit CDC's website at www.cdc.gov/vaccines. Source: CDC Vaccine Information Statement Tdap (Tetanus, Diphtheria, Pertussis) Vaccine (11/20/2019) This same material is available at www.cdc.gov for no charge. This information is not intended to replace advice given to you by your health care provider. Make sure you discuss any questions you have with your health care provider. Document Revised: 02/28/2021 Document Reviewed: 01/02/2021 Elsevier Patient Education  2023 Elsevier Inc.  

## 2022-03-31 LAB — 28 WEEK RH+PANEL
Basophils Absolute: 0 10*3/uL (ref 0.0–0.2)
Basos: 0 %
EOS (ABSOLUTE): 0.2 10*3/uL (ref 0.0–0.4)
Eos: 3 %
Gestational Diabetes Screen: 123 mg/dL (ref 70–139)
HIV Screen 4th Generation wRfx: NONREACTIVE
Hematocrit: 33.6 % — ABNORMAL LOW (ref 34.0–46.6)
Hemoglobin: 11.3 g/dL (ref 11.1–15.9)
Immature Grans (Abs): 0 10*3/uL (ref 0.0–0.1)
Immature Granulocytes: 0 %
Lymphocytes Absolute: 1.5 10*3/uL (ref 0.7–3.1)
Lymphs: 21 %
MCH: 30.2 pg (ref 26.6–33.0)
MCHC: 33.6 g/dL (ref 31.5–35.7)
MCV: 90 fL (ref 79–97)
Monocytes Absolute: 0.5 10*3/uL (ref 0.1–0.9)
Monocytes: 7 %
Neutrophils Absolute: 4.7 10*3/uL (ref 1.4–7.0)
Neutrophils: 69 %
Platelets: 207 10*3/uL (ref 150–450)
RBC: 3.74 x10E6/uL — ABNORMAL LOW (ref 3.77–5.28)
RDW: 12.9 % (ref 11.7–15.4)
RPR Ser Ql: NONREACTIVE
WBC: 6.8 10*3/uL (ref 3.4–10.8)

## 2022-04-03 ENCOUNTER — Encounter: Payer: Self-pay | Admitting: Obstetrics and Gynecology

## 2022-04-16 NOTE — L&D Delivery Note (Addendum)
This patient arrived to [facility] at approximately 1435. She was found to be 9+ cms dilated, and was immediately transferred to her labor room.  Delivery Note At  1518, a viable female was delivered vaginally,  The baby presented LOA, with restitution to ROT. Second stage was brief. The anterior shoulder was delivered with gentle downward guidance, and the posterior should followed easily. The baby was immediately placed on the maternal abdomen for drying and bonding. APGAR: 8, 9; weight pending .   Placenta status: delivered intact , at 1524 .  Cord: 3 vessel cord  with the following complications: none .    Anesthesia:  none Episiotomy:  none Lacerations:  none Suture Repair:  NA Est. Blood Loss (mL):  75  Mom to postpartum.  Baby to Couplet care / Skin to Skin.  Imagene Riches 06/21/2022, 3:37 PM

## 2022-04-20 ENCOUNTER — Ambulatory Visit (INDEPENDENT_AMBULATORY_CARE_PROVIDER_SITE_OTHER): Payer: BC Managed Care – PPO | Admitting: Certified Nurse Midwife

## 2022-04-20 VITALS — BP 119/73 | HR 78 | Wt 181.6 lb

## 2022-04-20 DIAGNOSIS — Z3A30 30 weeks gestation of pregnancy: Secondary | ICD-10-CM

## 2022-04-20 DIAGNOSIS — Z3483 Encounter for supervision of other normal pregnancy, third trimester: Secondary | ICD-10-CM

## 2022-04-20 LAB — POCT URINALYSIS DIPSTICK OB
Bilirubin, UA: NEGATIVE
Blood, UA: NEGATIVE
Glucose, UA: NEGATIVE
Ketones, UA: NEGATIVE
Leukocytes, UA: NEGATIVE
Nitrite, UA: NEGATIVE
POC,PROTEIN,UA: NEGATIVE
Spec Grav, UA: 1.01 (ref 1.010–1.025)
Urobilinogen, UA: 0.2 E.U./dL
pH, UA: 8 (ref 5.0–8.0)

## 2022-04-20 NOTE — Patient Instructions (Signed)
[facility] [facility]ian List  [facility]  [address], [place], [place] [address]  Phone: [phone]  [facility] (second location)  [address] [place], [place] [address]  Phone: [phone]  [facility] ([place]) [address], [place], [place] [address] Phone: [phone]  [facility]  [address] [place], [place] [address]  Phone: [phone] 

## 2022-04-20 NOTE — Progress Notes (Signed)
ROB doing well, has no concerns today. State baby is very active. Fundal hight measuring 25 wks, discussed u/s for growth. She is in agreement. Orders placed.   Philip Aspen, CNM

## 2022-04-23 ENCOUNTER — Other Ambulatory Visit: Payer: Self-pay

## 2022-04-23 MED ORDER — PRENATAL 27-0.8 MG PO TABS: 1.0000 | ORAL_TABLET | Freq: Every day | ORAL | 10 refills | Status: AC

## 2022-05-03 ENCOUNTER — Ambulatory Visit
Admission: RE | Admit: 2022-05-03 | Discharge: 2022-05-03 | Disposition: A | Discharge status: Home / Self Care | Payer: BC Managed Care – PPO | Source: Ambulatory Visit | Attending: Certified Nurse Midwife | Admitting: Certified Nurse Midwife

## 2022-05-03 DIAGNOSIS — O3663X0 Maternal care for excessive fetal growth, third trimester, not applicable or unspecified: Secondary | ICD-10-CM | POA: Diagnosis not present

## 2022-05-03 DIAGNOSIS — Z3A32 32 weeks gestation of pregnancy: Secondary | ICD-10-CM | POA: Diagnosis not present

## 2022-05-03 DIAGNOSIS — O26843 Uterine size-date discrepancy, third trimester: Secondary | ICD-10-CM | POA: Diagnosis not present

## 2022-05-03 DIAGNOSIS — Z3A30 30 weeks gestation of pregnancy: Secondary | ICD-10-CM | POA: Insufficient documentation

## 2022-05-04 ENCOUNTER — Ambulatory Visit (INDEPENDENT_AMBULATORY_CARE_PROVIDER_SITE_OTHER): Payer: BC Managed Care – PPO | Admitting: Licensed Practical Nurse

## 2022-05-04 ENCOUNTER — Encounter: Payer: Self-pay | Admitting: Licensed Practical Nurse

## 2022-05-04 VITALS — BP 124/61 | HR 74 | Wt 181.5 lb

## 2022-05-04 DIAGNOSIS — Z3480 Encounter for supervision of other normal pregnancy, unspecified trimester: Secondary | ICD-10-CM

## 2022-05-04 DIAGNOSIS — Z3A32 32 weeks gestation of pregnancy: Secondary | ICD-10-CM

## 2022-05-04 DIAGNOSIS — Z3483 Encounter for supervision of other normal pregnancy, third trimester: Secondary | ICD-10-CM

## 2022-05-04 NOTE — Progress Notes (Signed)
Routine Prenatal Care Visit  Subjective  Cassie Garza is a 32 y.o. G2P1001 at [redacted]w[redacted]d being seen today for ongoing prenatal care.  She is currently monitored for the following issues for this low-risk pregnancy and has BMI 30.0-30.9,adult; Chronic idiopathic constipation; and Supervision of other normal pregnancy, antepartum on their problem list.  ----------------------------------------------------------------------------------- Patient reports no complaints.  Here with her son. Doing well.  Feels reassured with current [place].  EFW 1933 grams, 33.8%, AFI 14.1  -Breastfed son for 10 moths, had a good experience. Plans to BF again -First labor was long, considering epidural this time   Contractions: Not present. Vag. Bleeding: None.  Movement: Present. Leaking Fluid denies.  ----------------------------------------------------------------------------------- The following portions of the patient's history were reviewed and updated as appropriate: allergies, current medications, past family history, past medical history, past social history, past surgical history and problem list. Problem list updated.  Objective  Blood pressure 124/61, pulse 74, weight 181 lb 8 oz (82.3 kg), last menstrual period 09/18/2021, currently breastfeeding. Pregravid weight 169 lb (76.7 kg) Total Weight Gain 12 lb 8 oz (5.67 kg) Urinalysis: Urine Protein    Urine Glucose    Fetal Status: Fetal Heart Rate (bpm): 155 Fundal Height: 32 cm Movement: Present     General:  Alert, oriented and cooperative. Patient is in no acute distress.  Skin: Skin is warm and dry. No rash noted.   Cardiovascular: Normal heart rate noted  Respiratory: Normal respiratory effort, no problems with respiration noted  Abdomen: Soft, gravid, appropriate for gestational age. Pain/Pressure: Absent     Pelvic:  Cervical exam deferred        Extremities: Normal range of motion.  Edema: None  Mental Status: Normal mood and affect. Normal  behavior. Normal judgment and thought content.   Assessment   32 y.o. G2P1001 at [redacted]w[redacted]d by  06/25/2022, by Last Menstrual Period presenting for routine prenatal visit  Plan   second Problems (from 01/18/22 to present)     Problem Noted Resolved   Supervision of other normal pregnancy, antepartum 01/18/2022 by Cleophas Dunker, CMA No   Overview Addendum 03/30/2022  9:54 AM by Chilton Greathouse, CMA     Clinical Staff Provider  Office Location  [facility] Dating  06/25/2022, by Last Menstrual Period  Language  English Anatomy US    Flu Vaccine  Declined Genetic Screen  NIPS: MaterniTI 21: Nml/Female (01/22/2022)  TDaP vaccine   03/30/2022 Hgb A1C or  GTT Early : Third trimester :   Covid    LAB RESULTS   Rhogam  A/Positive/-- (10/09 4970)  Blood Type A/Positive/-- (10/09 0829)   Feeding Plan Breast Antibody Negative (10/09 0829)  Contraception Undecided Rubella 6.43 (10/09 0829)  Circumcision NO RPR Non Reactive (10/09 0829)   Pediatrician  [place] HBsAg Negative (10/09 0829)   Support Person [name] HIV Non Reactive (10/09 2637)  Prenatal Classes No Varicella Immune (01/22/2022)    GBS  (For PCN allergy, check sensitivities)   BTL Consent  Hep C Non Reactive (10/09 0829)   VBAC Consent  Pap No results found for: "DIAGPAP"    Hgb Electro      CF      SMA                  Preterm labor symptoms and general obstetric precautions including but not limited to vaginal bleeding, contractions, leaking of fluid and fetal movement were reviewed in detail with the patient. Please refer to After Visit Summary  for other counseling recommendations.   Return in about 2 weeks (around 05/18/2022) for [place].  Roberto Scales, [facility]  05/04/22  11:17 AM

## 2022-05-17 ENCOUNTER — Encounter: Payer: Self-pay | Admitting: Licensed Practical Nurse

## 2022-05-17 ENCOUNTER — Ambulatory Visit (INDEPENDENT_AMBULATORY_CARE_PROVIDER_SITE_OTHER): Payer: BC Managed Care – PPO | Admitting: Licensed Practical Nurse

## 2022-05-17 VITALS — BP 121/77 | HR 81 | Wt 182.8 lb

## 2022-05-17 DIAGNOSIS — Z3A34 34 weeks gestation of pregnancy: Secondary | ICD-10-CM

## 2022-05-17 DIAGNOSIS — Z3480 Encounter for supervision of other normal pregnancy, unspecified trimester: Secondary | ICD-10-CM

## 2022-05-17 DIAGNOSIS — Z3483 Encounter for supervision of other normal pregnancy, third trimester: Secondary | ICD-10-CM

## 2022-05-17 LAB — POCT URINALYSIS DIPSTICK
Bilirubin, UA: NEGATIVE
Blood, UA: NEGATIVE
Glucose, UA: NEGATIVE
Ketones, UA: NEGATIVE
Leukocytes, UA: NEGATIVE
Nitrite, UA: NEGATIVE
Protein, UA: NEGATIVE
Spec Grav, UA: 1.01 (ref 1.010–1.025)
Urobilinogen, UA: 1 E.U./dL
pH, UA: 6.5 (ref 5.0–8.0)

## 2022-05-17 NOTE — Progress Notes (Signed)
Routine Prenatal Care Visit  Subjective  Cassie Garza is a 32 y.o. G2P1001 at [redacted]w[redacted]d being seen today for ongoing prenatal care.  She is currently monitored for the following issues for this low-risk pregnancy and has BMI 30.0-30.9,adult; Chronic idiopathic constipation; and Supervision of other normal pregnancy, antepartum on their problem list.  ----------------------------------------------------------------------------------- Patient reports Here with son Doing well. Feels great.  -reviewed GBS screening at next visit   Contractions: Not present. Vag. Bleeding: None.  Movement: Present. Leaking Fluid denies.  ----------------------------------------------------------------------------------- The following portions of the patient's history were reviewed and updated as appropriate: allergies, current medications, past family history, past medical history, past social history, past surgical history and problem list. Problem list updated.  Objective  Blood pressure 121/77, pulse 81, weight 182 lb 12.8 oz (82.9 kg), last menstrual period 09/18/2021, currently breastfeeding. Pregravid weight 169 lb (76.7 kg) Total Weight Gain 13 lb 12.8 oz (6.26 kg) Urinalysis: Urine Protein    Urine Glucose    Fetal Status: Fetal Heart Rate (bpm): 150 Fundal Height: 34 cm Movement: Present     General:  Alert, oriented and cooperative. Patient is in no acute distress.  Skin: Skin is warm and dry. No rash noted.   Cardiovascular: Normal heart rate noted  Respiratory: Normal respiratory effort, no problems with respiration noted  Abdomen: Soft, gravid, appropriate for gestational age. Pain/Pressure: Present     Pelvic:  Cervical exam deferred        Extremities: Normal range of motion.  Edema: Trace  Mental Status: Normal mood and affect. Normal behavior. Normal judgment and thought content.   Assessment   32 y.o. G2P1001 at [redacted]w[redacted]d by  06/25/2022, by Last Menstrual Period presenting for routine  prenatal visit  Plan   second Problems (from 01/18/22 to present)     Problem Noted Resolved   Supervision of other normal pregnancy, antepartum 01/18/2022 by Cleophas Dunker, CMA No   Overview Addendum 03/30/2022  9:54 AM by Chilton Greathouse, CMA     Clinical Staff Provider  Office Location  [facility] Dating  06/25/2022, by Last Menstrual Period  Language  English Anatomy US    Flu Vaccine  Declined Genetic Screen  NIPS: MaterniTI 21: Nml/Female (01/22/2022)  TDaP vaccine   03/30/2022 Hgb A1C or  GTT Early : Third trimester :   Covid    LAB RESULTS   Rhogam  A/Positive/-- (10/09 1856)  Blood Type A/Positive/-- (10/09 0829)   Feeding Plan Breast Antibody Negative (10/09 0829)  Contraception Undecided Rubella 6.43 (10/09 0829)  Circumcision NO RPR Non Reactive (10/09 0829)   Pediatrician  [place] HBsAg Negative (10/09 0829)   Support Person [name] HIV Non Reactive (10/09 3149)  Prenatal Classes No Varicella Immune (01/22/2022)    GBS  (For PCN allergy, check sensitivities)   BTL Consent  Hep C Non Reactive (10/09 0829)   VBAC Consent  Pap No results found for: "DIAGPAP"    Hgb Electro      CF      SMA                  Preterm labor symptoms and general obstetric precautions including but not limited to vaginal bleeding, contractions, leaking of fluid and fetal movement were reviewed in detail with the patient. Please refer to After Visit Summary for other counseling recommendations.   Return in about 2 weeks (around 05/31/2022) for [place], [place] labs.  Roberto Scales, [facility]  05/17/22  4:39  PM    

## 2022-06-01 ENCOUNTER — Ambulatory Visit (INDEPENDENT_AMBULATORY_CARE_PROVIDER_SITE_OTHER): Payer: BC Managed Care – PPO | Admitting: Obstetrics

## 2022-06-01 ENCOUNTER — Encounter: Payer: Self-pay | Admitting: Obstetrics

## 2022-06-01 ENCOUNTER — Other Ambulatory Visit (HOSPITAL_COMMUNITY)
Admission: RE | Admit: 2022-06-01 | Discharge: 2022-06-01 | Disposition: A | Discharge status: Home / Self Care | Payer: BC Managed Care – PPO | Source: Ambulatory Visit | Attending: Obstetrics | Admitting: Obstetrics

## 2022-06-01 VITALS — BP 117/71 | HR 84 | Wt 184.4 lb

## 2022-06-01 DIAGNOSIS — Z3482 Encounter for supervision of other normal pregnancy, second trimester: Secondary | ICD-10-CM

## 2022-06-01 DIAGNOSIS — Z3A36 36 weeks gestation of pregnancy: Secondary | ICD-10-CM

## 2022-06-01 DIAGNOSIS — Z113 Encounter for screening for infections with a predominantly sexual mode of transmission: Secondary | ICD-10-CM | POA: Insufficient documentation

## 2022-06-01 DIAGNOSIS — Z3685 Encounter for antenatal screening for Streptococcus B: Secondary | ICD-10-CM | POA: Diagnosis not present

## 2022-06-01 DIAGNOSIS — O26843 Uterine size-date discrepancy, third trimester: Secondary | ICD-10-CM

## 2022-06-01 LAB — POCT URINALYSIS DIPSTICK OB
Bilirubin, UA: NEGATIVE
Blood, UA: NEGATIVE
Glucose, UA: NEGATIVE
Ketones, UA: NEGATIVE
Leukocytes, UA: NEGATIVE
Nitrite, UA: NEGATIVE
POC,PROTEIN,UA: NEGATIVE
Spec Grav, UA: 1.01 (ref 1.010–1.025)
Urobilinogen, UA: 0.2 E.U./dL
pH, UA: 7.5 (ref 5.0–8.0)

## 2022-06-01 NOTE — Progress Notes (Addendum)
Routine Prenatal Care Visit  Subjective  Cassie Garza is a 32 y.o. G2P1001 at 21w4dbeing seen today for ongoing prenatal care.  She is currently monitored for the following issues for this low-risk pregnancy and has BMI 30.0-30.9,adult; Chronic idiopathic constipation; and Supervision of other normal pregnancy, antepartum on their problem list.  ----------------------------------------------------------------------------------- Patient reports no complaints.  She has  not named her son yet. Denies any regular Ucs , LOF or vaginal bleeding. Contractions: Not present. Vag. Bleeding: None.  Movement: Present. Leaking Fluid denies.  ----------------------------------------------------------------------------------- The following portions of the patient's history were reviewed and updated as appropriate: allergies, current medications, past family history, past medical history, past social history, past surgical history and problem list. Problem list updated.  Objective  Blood pressure 117/71, pulse 84, weight 184 lb 6.4 oz (83.6 kg), last menstrual period 09/18/2021, currently breastfeeding. Pregravid weight 169 lb (76.7 kg) Total Weight Gain 15 lb 6.4 oz (6.985 kg) Urinalysis: Urine Protein Negative  Urine Glucose Negative Fetal heart tones are in the 150s. Vertex confirmed with bedside [place] Fetal Status:     Movement: Present     General:  Alert, oriented and cooperative. Patient is in no acute distress.  Skin: Skin is warm and dry. No rash noted.   Cardiovascular: Normal heart rate noted  Respiratory: Normal respiratory effort, no problems with respiration noted  Abdomen: Soft, gravid, appropriate for gestational age. Pain/Pressure: Present     Pelvic:  Cervical exam deferred        Extremities: Normal range of motion.  Edema: None  Mental Status: Normal mood and affect. Normal behavior. Normal judgment and thought content.   Assessment   32y.o. G2P1001 at 333w4dy  06/25/2022,  by Last Menstrual Period presenting for routine prenatal visit  Plan   second Problems (from 01/18/22 to present)     Problem Noted Resolved   Supervision of other normal pregnancy, antepartum 01/18/2022 by [name] No   Overview Addendum 03/30/2022  9:54 AM by [name]     Clinical Staff Provider  Office Location   [facility] Dating  06/25/2022, by Last Menstrual Period  Language  English Anatomy [place]  Flu Vaccine  Declined Genetic Screen  NIPS: MaterniTI 21: Nml/Female (01/22/2022)  TDaP vaccine   03/30/2022 Hgb A1C or  GTT Early : Third trimester :   Covid    LAB RESULTS   Rhogam  A/Positive/-- (10/09 [id number] Blood Type A/Positive/-- (10/09 0829)   Feeding Plan Breast Antibody Negative (10/09 0829)  Contraception Undecided Rubella 6.43 (10/09 0829)  Circumcision NO RPR Non Reactive (10/09 0829)   [place] (10/09 0829)   Support Person [name] HIV Non Reactive (10/09 [id number] Prenatal Classes No Varicella Immune (01/22/2022)    GBS  (For PCN allergy, check sensitivities)   BTL Consent  Hep C Non Reactive (10/09 0829)   VBAC Consent  Pap No results found for: "DIAGPAP"    Hgb Electro      CF      SMA                  Preterm labor symptoms and general obstetric precautions including but not limited to vaginal bleeding, contractions, leaking of fluid and fetal movement were reviewed in detail with the patient. Please refer to After Visit Summary for other counseling recommendations.   Return in about 1 week (around 06/08/2022) for return OB.  Cassie RichesCNM  06/01/2022 10:54 AM

## 2022-06-01 NOTE — Progress Notes (Signed)
ROB [redacted]w[redacted]d She is doing well, she has increased pelvic pressure and good fetal movement. GBS/G/c done today.

## 2022-06-03 LAB — STREP GP B NAA: Strep Gp B NAA: POSITIVE — AB

## 2022-06-04 LAB — CERVICOVAGINAL ANCILLARY ONLY
Chlamydia: NEGATIVE
Comment: NEGATIVE
Comment: NORMAL
Neisseria Gonorrhea: NEGATIVE

## 2022-06-11 ENCOUNTER — Other Ambulatory Visit: Payer: BC Managed Care – PPO

## 2022-06-11 ENCOUNTER — Ambulatory Visit (INDEPENDENT_AMBULATORY_CARE_PROVIDER_SITE_OTHER): Payer: BC Managed Care – PPO | Admitting: Obstetrics

## 2022-06-11 VITALS — BP 122/76 | HR 85 | Wt 186.8 lb

## 2022-06-11 DIAGNOSIS — Z3483 Encounter for supervision of other normal pregnancy, third trimester: Secondary | ICD-10-CM

## 2022-06-11 DIAGNOSIS — Z3482 Encounter for supervision of other normal pregnancy, second trimester: Secondary | ICD-10-CM

## 2022-06-11 DIAGNOSIS — Z3A38 38 weeks gestation of pregnancy: Secondary | ICD-10-CM

## 2022-06-11 LAB — POCT URINALYSIS DIPSTICK OB
Bilirubin, UA: NEGATIVE
Blood, UA: NEGATIVE
Glucose, UA: NEGATIVE
Ketones, UA: NEGATIVE
Leukocytes, UA: NEGATIVE
Nitrite, UA: NEGATIVE
POC,PROTEIN,UA: NEGATIVE
Spec Grav, UA: <=1.005 — AB (ref 1.010–1.025)
Urobilinogen, UA: 0.2 E.U./dL
pH, UA: 7 (ref 5.0–8.0)

## 2022-06-11 NOTE — Progress Notes (Signed)
Routine Prenatal Care Visit  Subjective  Cassie Garza is a 32 y.o. G2P1001 at 26w0dbeing seen today for ongoing prenatal care.  She is currently monitored for the following issues for this low-risk pregnancy and has BMI 30.0-30.9,adult; Chronic idiopathic constipation; and Supervision of other normal pregnancy, antepartum on their problem list.  ----------------------------------------------------------------------------------- Patient reports no complaints.  No regular contractions or vaginal bleeding or LOF. She declines a vaginal exam today. Contractions: Not present. Vag. Bleeding: None.  Movement: Present. Leaking Fluid denies.  ----------------------------------------------------------------------------------- The following portions of the patient's history were reviewed and updated as appropriate: allergies, current medications, past family history, past medical history, past social history, past surgical history and problem list. Problem list updated.  Objective  Blood pressure 122/76, pulse 85, weight 186 lb 12.8 oz (84.7 kg), last menstrual period 09/18/2021, currently breastfeeding. Pregravid weight 169 lb (76.7 kg) Total Weight Gain 17 lb 12.8 oz (8.074 kg) Urinalysis: Urine Protein Negative  Urine Glucose Negative  Fetal Status:     Movement: Present     General:  Alert, oriented and cooperative. Patient is in no acute distress.  Skin: Skin is warm and dry. No rash noted.   Cardiovascular: Normal heart rate noted  Respiratory: Normal respiratory effort, no problems with respiration noted  Abdomen: Soft, gravid, appropriate for gestational age. Pain/Pressure: Present     Pelvic:  Cervical exam deferred        Extremities: Normal range of motion.  Edema: None  Mental Status: Normal mood and affect. Normal behavior. Normal judgment and thought content.   Assessment   32y.o. G2P1001 at 318w0dy  06/25/2022, by Last Menstrual Period presenting for routine prenatal  visit  Plan   second Problems (from 01/18/22 to present)     Problem Noted Resolved   Supervision of other normal pregnancy, antepartum 01/18/2022 by [name] No   Overview Addendum 06/03/2022  9:19 AM by FrImagene RichesCNM     Clinical Staff Provider  Office Location  [facility] Dating  06/25/2022, by Last Menstrual Period  Language  English Anatomy [place]  Flu Vaccine  Declined Genetic Screen  NIPS: MaterniTI 21: Nml/Female (01/22/2022)  TDaP vaccine   03/30/2022 Hgb A1C or  GTT Early : Third trimester :   Covid    LAB RESULTS   Rhogam  A/Positive/-- (10/09 [id number] Blood Type A/Positive/-- (10/09 0829)   Feeding Plan Breast Antibody Negative (10/09 0829)  Contraception Undecided Rubella 6.43 (10/09 0829)  Circumcision NO RPR Non Reactive (10/09 0829)   Pediatrician  [place] Negative (10/09 0829)   Support Person [name] HIV Non Reactive (10/09 [id number] Prenatal Classes No Varicella Immune (01/22/2022)    GBS  (For PCN allergy, check sensitivities) positive  BTL Consent  Hep C Non Reactive (10/09 0829)   VBAC Consent  Pap No results found for: "DIAGPAP"    Hgb Electro      CF      SMA                  Term labor symptoms and general obstetric precautions including but not limited to vaginal bleeding, contractions, leaking of fluid and fetal movement were reviewed in detail with the patient. Please refer to After Visit Summary for other counseling recommendations.   Return in about 1 week (around 06/18/2022) for return OB.  [name]  06/11/2022 4:06 PM

## 2022-06-12 ENCOUNTER — Encounter: Payer: BC Managed Care – PPO | Admitting: Obstetrics

## 2022-06-13 ENCOUNTER — Ambulatory Visit
Admission: RE | Admit: 2022-06-13 | Discharge: 2022-06-13 | Disposition: A | Discharge status: Home / Self Care | Payer: BC Managed Care – PPO | Source: Ambulatory Visit | Attending: Obstetrics | Admitting: Obstetrics

## 2022-06-13 DIAGNOSIS — O26843 Uterine size-date discrepancy, third trimester: Secondary | ICD-10-CM | POA: Diagnosis not present

## 2022-06-13 DIAGNOSIS — Z3A38 38 weeks gestation of pregnancy: Secondary | ICD-10-CM | POA: Diagnosis not present

## 2022-06-13 DIAGNOSIS — Z3689 Encounter for other specified antenatal screening: Secondary | ICD-10-CM | POA: Diagnosis not present

## 2022-06-21 ENCOUNTER — Inpatient Hospital Stay
Admission: EM | Admit: 2022-06-21 | Discharge: 2022-06-23 | DRG: 807 | Disposition: A | Discharge status: Home / Self Care | Payer: BC Managed Care – PPO | Attending: Certified Nurse Midwife | Admitting: Certified Nurse Midwife

## 2022-06-21 ENCOUNTER — Other Ambulatory Visit: Payer: Self-pay

## 2022-06-21 ENCOUNTER — Encounter: Payer: Self-pay | Admitting: Obstetrics

## 2022-06-21 DIAGNOSIS — Z3A39 39 weeks gestation of pregnancy: Secondary | ICD-10-CM | POA: Diagnosis not present

## 2022-06-21 DIAGNOSIS — O99824 Streptococcus B carrier state complicating childbirth: Secondary | ICD-10-CM | POA: Diagnosis not present

## 2022-06-21 DIAGNOSIS — Z87891 Personal history of nicotine dependence: Secondary | ICD-10-CM

## 2022-06-21 DIAGNOSIS — O479 False labor, unspecified: Principal | ICD-10-CM | POA: Diagnosis present

## 2022-06-21 DIAGNOSIS — O99820 Streptococcus B carrier state complicating pregnancy: Secondary | ICD-10-CM | POA: Diagnosis not present

## 2022-06-21 DIAGNOSIS — O26893 Other specified pregnancy related conditions, third trimester: Secondary | ICD-10-CM | POA: Diagnosis not present

## 2022-06-21 DIAGNOSIS — Z23 Encounter for immunization: Secondary | ICD-10-CM | POA: Diagnosis not present

## 2022-06-21 MED ORDER — ONDANSETRON HCL 4 MG PO TABS: 4.0000 mg | ORAL_TABLET | ORAL | Status: DC | PRN

## 2022-06-21 MED ORDER — PRENATAL MULTIVITAMIN CH
1.0000 | ORAL_TABLET | Freq: Every day | ORAL | Status: DC
  Administered 2022-06-22: 1 via ORAL
  Filled 2022-06-21: qty 1

## 2022-06-21 MED ORDER — OXYCODONE-ACETAMINOPHEN 5-325 MG PO TABS: 2.0000 | ORAL_TABLET | ORAL | Status: DC | PRN

## 2022-06-21 MED ORDER — LACTATED RINGERS IV SOLN: INTRAVENOUS | Status: DC

## 2022-06-21 MED ORDER — ONDANSETRON HCL 4 MG/2ML IJ SOLN: 4.0000 mg | INTRAMUSCULAR | Status: DC | PRN

## 2022-06-21 MED ORDER — ACETAMINOPHEN 325 MG PO TABS
650.0000 mg | ORAL_TABLET | ORAL | Status: DC | PRN
  Administered 2022-06-21 – 2022-06-22 (×2): 650 mg via ORAL
  Filled 2022-06-21 (×2): qty 2

## 2022-06-21 MED ORDER — ACETAMINOPHEN 325 MG PO TABS: 650.0000 mg | ORAL_TABLET | ORAL | Status: DC | PRN

## 2022-06-21 MED ORDER — SODIUM CHLORIDE 0.9 % IV SOLN: 1.0000 g | INTRAVENOUS | Status: DC

## 2022-06-21 MED ORDER — IBUPROFEN 600 MG PO TABS
600.0000 mg | ORAL_TABLET | Freq: Four times a day (QID) | ORAL | Status: DC
  Administered 2022-06-21 – 2022-06-23 (×7): 600 mg via ORAL
  Filled 2022-06-21 (×7): qty 1

## 2022-06-21 MED ORDER — WITCH HAZEL-GLYCERIN EX PADS
1.0000 | MEDICATED_PAD | CUTANEOUS | Status: DC | PRN
  Administered 2022-06-21: 1 via TOPICAL
  Filled 2022-06-21: qty 100

## 2022-06-21 MED ORDER — OXYTOCIN-SODIUM CHLORIDE 30-0.9 UT/500ML-% IV SOLN: 2.5000 [IU]/h | INTRAVENOUS | Status: DC

## 2022-06-21 MED ORDER — DOCUSATE SODIUM 100 MG PO CAPS
100.0000 mg | ORAL_CAPSULE | Freq: Two times a day (BID) | ORAL | Status: DC
  Administered 2022-06-22 (×2): 100 mg via ORAL
  Filled 2022-06-21 (×2): qty 1

## 2022-06-21 MED ORDER — AMMONIA AROMATIC IN INHA
RESPIRATORY_TRACT | Status: AC
  Filled 2022-06-21: qty 10

## 2022-06-21 MED ORDER — SODIUM CHLORIDE 0.9 % IV SOLN: 2.0000 g | Freq: Once | INTRAVENOUS | Status: DC

## 2022-06-21 MED ORDER — BENZOCAINE-MENTHOL 20-0.5 % EX AERO
1.0000 | INHALATION_SPRAY | CUTANEOUS | Status: DC | PRN
  Administered 2022-06-21: 1 via TOPICAL
  Filled 2022-06-21: qty 56

## 2022-06-21 MED ORDER — LIDOCAINE HCL (PF) 1 % IJ SOLN: 30.0000 mL | INTRAMUSCULAR | Status: DC | PRN

## 2022-06-21 MED ORDER — SIMETHICONE 80 MG PO CHEW: 80.0000 mg | CHEWABLE_TABLET | ORAL | Status: DC | PRN

## 2022-06-21 MED ORDER — DIBUCAINE (PERIANAL) 1 % EX OINT
1.0000 | TOPICAL_OINTMENT | CUTANEOUS | Status: DC | PRN
  Administered 2022-06-21: 1 via RECTAL
  Filled 2022-06-21: qty 28

## 2022-06-21 MED ORDER — TETANUS-DIPHTH-ACELL PERTUSSIS 5-2.5-18.5 LF-MCG/0.5 IM SUSY
0.5000 mL | PREFILLED_SYRINGE | Freq: Once | INTRAMUSCULAR | Status: DC
  Filled 2022-06-21: qty 0.5

## 2022-06-21 MED ORDER — MISOPROSTOL 200 MCG PO TABS
ORAL_TABLET | ORAL | Status: AC
  Filled 2022-06-21: qty 4

## 2022-06-21 MED ORDER — OXYTOCIN-SODIUM CHLORIDE 30-0.9 UT/500ML-% IV SOLN
INTRAVENOUS | Status: AC
  Filled 2022-06-21: qty 500

## 2022-06-21 MED ORDER — OXYTOCIN BOLUS FROM INFUSION
333.0000 mL | Freq: Once | INTRAVENOUS | Status: AC
  Administered 2022-06-21: 333 mL via INTRAVENOUS

## 2022-06-21 MED ORDER — ZOLPIDEM TARTRATE 5 MG PO TABS: 5.0000 mg | ORAL_TABLET | Freq: Every evening | ORAL | Status: DC | PRN

## 2022-06-21 MED ORDER — OXYTOCIN 10 UNIT/ML IJ SOLN
INTRAMUSCULAR | Status: AC
  Filled 2022-06-21: qty 2

## 2022-06-21 MED ORDER — COCONUT OIL OIL
1.0000 | TOPICAL_OIL | Status: DC | PRN
  Filled 2022-06-21: qty 15

## 2022-06-21 MED ORDER — LACTATED RINGERS IV SOLN: 500.0000 mL | INTRAVENOUS | Status: DC | PRN

## 2022-06-21 MED ORDER — SOD CITRATE-CITRIC ACID 500-334 MG/5ML PO SOLN: 30.0000 mL | ORAL | Status: DC | PRN

## 2022-06-21 MED ORDER — OXYCODONE-ACETAMINOPHEN 5-325 MG PO TABS: 1.0000 | ORAL_TABLET | ORAL | Status: DC | PRN

## 2022-06-21 MED ORDER — DIPHENHYDRAMINE HCL 25 MG PO CAPS: 25.0000 mg | ORAL_CAPSULE | Freq: Four times a day (QID) | ORAL | Status: DC | PRN

## 2022-06-21 MED ORDER — ONDANSETRON HCL 4 MG/2ML IJ SOLN: 4.0000 mg | Freq: Four times a day (QID) | INTRAMUSCULAR | Status: DC | PRN

## 2022-06-21 MED ORDER — LIDOCAINE HCL (PF) 1 % IJ SOLN
INTRAMUSCULAR | Status: AC
  Filled 2022-06-21: qty 30

## 2022-06-21 NOTE — Discharge Summary (Signed)
Postpartum Discharge Summary  Date of Service updated 06/21/2022     Patient Name: Cassie Garza DOB: 25-Jul-1990 MRN: [id number]  Date of admission: 06/21/2022 Delivery date: 06/21/2022  Delivering provider: Imagene Riches  Date of discharge: 06/23/2022  Admitting diagnosis: Uterine contractions [O47.9] Intrauterine pregnancy: [redacted]w[redacted]d     Secondary diagnosis:  Active Problems:   Postpartum care following vaginal delivery   Encounter for care or examination of lactating mother Normal spontaneous vaginal delivery Additional problems: none    Discharge diagnosis: Term Pregnancy Delivered                                              Post partum procedures: none Augmentation: N/A Complications: None  Hospital course: Onset of Labor With Vaginal Delivery      32 y.o. yo G2P1001 at [redacted]w[redacted]d was admitted in Active Labor on 06/21/2022. Labor course was complicated by NA  Membrane Rupture Time/Date: 3:10 PM ,06/21/2022   Delivery Method:Vaginal, Spontaneous  Episiotomy: None  Lacerations:  None  See delivery note for details  Patient had a postpartum course complicated by NA. She stayed 48 hours for inadequate GBS treatment.  She is ambulating, tolerating a regular diet, passing flatus, and urinating well.   Patient is discharged home in stable condition on 06/23/22.  Newborn Data: Birth date:06/21/2022  Birth time:3:18 PM  Gender:Female  Living status:Living  Apgars:8 ,9  Weight:3060 g   Magnesium Sulfate received: No BMZ received: No Rhophylac:N/A MMR:No T-DaP:Given prenatally Flu: No Transfusion:No  Physical exam  Vitals:   06/22/22 1212 06/22/22 1946 06/22/22 2256 06/23/22 0734  BP: 121/76 118/68 132/82 109/68  Pulse: 72 72 70 66  Resp: 18 18 18 18   Temp: 98.7 F (37.1 C) 98.2 F (36.8 C) 97.8 F (36.6 C) 98.2 F (36.8 C)  TempSrc: Oral Oral Oral Oral  SpO2:  100% 100% 100%  Weight:      Height:       General: alert, cooperative, and no distress Lochia:  appropriate Uterine Fundus: firm Incision: N/A DVT Evaluation: No evidence of DVT seen on physical exam. Labs: Lab Results  Component Value Date   WBC 10.3 06/22/2022   HGB 10.4 (L) 06/22/2022   HCT 31.0 (L) 06/22/2022   MCV 83.3 06/22/2022   PLT 208 06/22/2022      Latest Ref Rng & Units 06/08/2019   10:28 AM  CMP  Total Protein 6.5 - 8.1 g/dL 8.4   Total Bilirubin 0.3 - 1.2 mg/dL 1.1   Alkaline Phos 38 - 126 U/L 95   AST 15 - 41 U/L 46   ALT 0 - 44 U/L 52    Edinburgh Score:    06/21/2022    8:00 PM  Edinburgh Postnatal Depression Scale Screening Tool  I have been able to laugh and see the funny side of things. 0  I have looked forward with enjoyment to things. 0  I have blamed myself unnecessarily when things went wrong. 1  I have been anxious or worried for no good reason. 1  I have felt scared or panicky for no good reason. 0  Things have been getting on top of me. 0  I have been so unhappy that I have had difficulty sleeping. 0  I have felt sad or miserable. 0  I have been so unhappy that I have been crying. 0  The thought of harming myself has occurred to me. 0  Edinburgh Postnatal Depression Scale Total 2      After visit meds:  Allergies as of 06/23/2022   No Known Allergies      Medication List     TAKE these medications    multivitamin-prenatal 27-0.8 MG Tabs tablet Take 1 tablet by mouth daily at 12 noon.         Discharge home in stable condition Infant Feeding: Breast Infant Disposition:home with mother Discharge instruction: per After Visit Summary and Postpartum booklet. Activity: Advance as tolerated. Pelvic rest for 6 weeks.  Diet: routine diet Anticipated Birth Control: Unsure Postpartum Appointment:6 weeks Additional Postpartum F/U: Postpartum Depression checkup Future Appointments: Future Appointments  Date Time Provider [place]  07/10/2022  3:55 PM Imagene Riches, CNM [facility] None  07/26/2022 10:55 AM Imagene Riches, CNM [facility] None    Follow up Visit:  Follow-up Information     Imagene Riches, CNM. Go on 06/23/2022.   Specialties: Obstetrics, Gynecology Why: 2-week video follow-up: Tuesday, 3/26 at 3:55pm with Gigi Gin, CNM at [facility] Contact information: [place] [address] [phone]         Imagene Riches, CNM. Go on 07/26/2022.   Specialties: Obstetrics, Gynecology Why: 6-week postpartum visit: Thursday, 4/11 at 10:55am with Gigi Gin, CNM at [facility] information: [address] [place] [address] [phone]                    06/23/2022 Rod Can, CNM

## 2022-06-21 NOTE — H&P (Signed)
Cassie Garza is a 32 y.o. female presenting for labor. OB History     Gravida  2   Para  1   Term  1   Preterm  0   AB  0   Living  1      SAB  0   IAB  0   Ectopic  0   Multiple  0   Live Births  1        Obstetric Comments  History of rescue cerclage in 2nd trimester first pregnancy        Past Medical History:  Diagnosis Date   Chlamydia infection affecting pregnancy 07/15/2017   - Treated 4/1 '[ ]'$  needs TOC early May   Past Surgical History:  Procedure Laterality Date   CERVICAL CERCLAGE N/A 07/29/2017   Procedure: CERCLAGE CERVICAL;  Surgeon: Homero Fellers, MD;  Location: [facility] ORS;  Service: Gynecology;  Laterality: N/A;   Family History: family history includes Cancer (age of onset: 90) in her brother; Diabetes in her maternal grandmother and paternal grandmother; Hypertension in her maternal grandmother. Social History:  reports that she has quit smoking. Her smoking use included cigarettes. She has been exposed to tobacco smoke. She has never used smokeless tobacco. She reports that she does not currently use alcohol. She reports that she does not use drugs.     Maternal Diabetes: No Genetic Screening: Normal Maternal Ultrasounds/Referrals: Normal Fetal Ultrasounds or other Referrals:  None Maternal Substance Abuse:  No Significant Maternal Medications:  None Significant Maternal Lab Results:  Group B Strep positive Number of Prenatal Visits:greater than 3 verified prenatal visits Other Comments:  None  Review of Systems  Constitutional: Negative.   HENT: Negative.    Eyes: Negative.   Respiratory: Negative.    Cardiovascular: Negative.   Gastrointestinal:        Gravid abdomen  Endocrine: Negative.   Genitourinary: Negative.   Skin: Negative.   Psychiatric/Behavioral:  The patient is nervous/anxious.    History Dilation: Lip/rim Effacement (%): 100 Exam by:: Berta Minor CNM Last menstrual period 09/18/2021, currently  breastfeeding. Maternal Exam:  Uterine Assessment: Contraction strength is moderate.  Contraction frequency is regular.  Abdomen: Fetal presentation: vertex Introitus: Normal vulva. Normal vagina.  Pelvis: adequate for delivery.   Cervix: Cervix evaluated by digital exam.     Physical Exam Constitutional:      Appearance: Normal appearance.  Cardiovascular:     Rate and Rhythm: Normal rate and regular rhythm.     Pulses: Normal pulses.     Heart sounds: Normal heart sounds.  Pulmonary:     Effort: Pulmonary effort is normal.     Breath sounds: Normal breath sounds.  Abdominal:     Comments: Active labor- gravid abdomen  Genitourinary:    General: Normal vulva.     Rectum: Normal.     Comments: Blood y show noted. VE: rim of cervix. +2 station. Musculoskeletal:     Cervical back: Normal range of motion and neck supple.  Neurological:     Mental Status: She is alert.  Psychiatric:        Mood and Affect: Mood normal.        Behavior: Behavior normal.     Prenatal labs: ABO, Rh: A/Positive/-- (10/09 [id number]) Antibody: Negative (10/09 0829) Rubella: 6.43 (10/09 0829) RPR: Non Reactive (12/15 1050)  HBsAg: Negative (10/09 0829)  HIV: Non Reactive (12/15 1050)  GBS: Positive/-- (02/16 1145)   Assessment/Plan: IUP 39+ weeks . Arrives in  transitional labor, at 9 + cms dilated. Reassuring fetal heart tones GBS + Admit quickly for labor . IV antibiotics if time to address the GBS EFM Routine admission labs for labor. Anticipate SVD Dr. Marcelline Mates notified via text.  Imagene Riches, CNM  06/21/2022 2:51 PM     Imagene Riches 06/21/2022, 2:48 PM

## 2022-06-22 ENCOUNTER — Encounter: Payer: BC Managed Care – PPO | Admitting: Licensed Practical Nurse

## 2022-06-22 DIAGNOSIS — Z3482 Encounter for supervision of other normal pregnancy, second trimester: Secondary | ICD-10-CM

## 2022-06-22 DIAGNOSIS — Z3A39 39 weeks gestation of pregnancy: Secondary | ICD-10-CM

## 2022-06-22 LAB — CBC
HCT: 31 % — ABNORMAL LOW (ref 36.0–46.0)
Hemoglobin: 10.4 g/dL — ABNORMAL LOW (ref 12.0–15.0)
MCH: 28 pg (ref 26.0–34.0)
MCHC: 33.5 g/dL (ref 30.0–36.0)
MCV: 83.3 fL (ref 80.0–100.0)
Platelets: 208 10*3/uL (ref 150–400)
RBC: 3.72 MIL/uL — ABNORMAL LOW (ref 3.87–5.11)
RDW: 14.1 % (ref 11.5–15.5)
WBC: 10.3 10*3/uL (ref 4.0–10.5)
nRBC: 0 % (ref 0.0–0.2)

## 2022-06-22 NOTE — Progress Notes (Signed)
Progress Note - Vaginal Delivery  Cassie Garza is a 32 y.o. G2P1001 now PP day 1 s/p Vaginal, Spontaneous .   Subjective:  The patient reports no complaints, up ad lib, voiding, and tolerating PO   Objective:  Vital signs in last 24 hours: Temp:  [97.9 F (36.6 C)-99.4 F (37.4 C)] 98.5 F (36.9 C) (03/08 0907) Pulse Rate:  [63-90] 63 (03/08 0907) Resp:  [16-20] 20 (03/08 0907) BP: (111-133)/(68-81) 117/81 (03/08 0907) SpO2:  [95 %-99 %] 98 % (03/08 0907) Weight:  [84.7 kg] 84.7 kg (03/07 1530)  Physical Exam:  General: alert, cooperative, appears stated age, and no distress Lochia: appropriate Uterine Fundus: firm    Data Review Recent Labs    06/22/22 0609  HGB 10.4*  HCT 31.0*    Assessment/Plan: Active Problems:   Postpartum care following vaginal delivery   Plan for discharge tomorrow   -- Continue routine PP care.     Philip Aspen, CNM  06/22/2022 10:40 AM

## 2022-06-22 NOTE — Discharge Instructions (Addendum)
Discharge Instructions:   Follow-up Appointment:  2-week video follow-up: Tuesday, 3/26 at 3:55pm with Gigi Gin, CNM at [facility]  6-week postpartum visit: Thursday, 4/11 at 10:55am with Gigi Gin, CNM at [facility]   If there are any new medications, they have been ordered and will be available for pickup at the listed pharmacy on your way home from the hospital.   Call office if you have any of the following: headache, visual changes, fever >101.0 F, chills, shortness of breath, breast concerns, excessive vaginal bleeding, incision drainage or problems, leg pain or redness, depression or any other concerns. If you have vaginal discharge with an odor, let your doctor know.   It is normal to bleed for up to 6 weeks. You should not soak through more than 1 pad in 1 hour. If you have a blood clot larger than your fist with continued bleeding, call your doctor.   Activity: Do not lift > 10 lbs for 6 weeks (do not lift anything heavier than your baby). No intercourse, tampons, swimming pools, hot tubs, baths (only showers) for 6 weeks.  No driving for 1-2 weeks. Continue prenatal vitamin, especially if breastfeeding. Increase calories and fluids (water) while breastfeeding.   Your milk will come in, in the next couple of days (right now it is colostrum). You may have a slight fever when your milk comes in, but it should go away on its own.  If it does not, and rises above 101 F please call the doctor. You will also feel achy and your breasts will be firm. They will also start to leak. If you are breastfeeding, continue as you have been and you can pump/express milk for comfort.   If you have too much milk, your breasts can become engorged, which could lead to mastitis. This is an infection of the milk ducts. It can be very painful and you will need to notify your doctor to obtain a prescription for antibiotics. You can also treat it with a shower or hot/cold compress.   For  concerns about your baby, please call your pediatrician.  For breastfeeding concerns, the lactation consultant can be reached at [phone].   Postpartum blues (feelings of happy one minute and sad another minute) are normal for the first few weeks but if it gets worse let your doctor know.   Congratulations! We enjoyed caring for you and your new bundle of joy!

## 2022-06-22 NOTE — Lactation Note (Signed)
This note was copied from a baby's chart. Lactation Consultation Note  Patient Name: Cassie Garza [id number] Date: 06/22/2022 Age:34 hours Reason for consult: Initial assessment;Term;RN request   Maternal Data This is mom's 2nd baby, SVD. Mom is an experienced BF mother.  On initial consult today mom with questions regarding baby wanting to feed frequently. Has patient been taught Hand Expression?: Yes Does the patient have breastfeeding experience prior to this delivery?: Yes How long did the patient breastfeed?: 10 months  Feeding Mother's Current Feeding Choice: Breast Milk and Formula. Mom has been exclusively breastfeeding so far. Baby observed latched and breastfeeding well with swallows. Mom independently breastfeeding.  LATCH Score Latch: Grasps breast easily, tongue down, lips flanged, rhythmical sucking.  Audible Swallowing: Spontaneous and intermittent  Type of Nipple: Everted at rest and after stimulation  Comfort (Breast/Nipple): Soft / non-tender  Hold (Positioning): No assistance needed to correctly position infant at breast.  LATCH Score: 10  Interventions Interventions: Breast feeding basics reviewed;Education Reviewed with mom frequency of feedings, feeding cues, normative cluster feeding, and what to expect the first days when breastfeeding.  Discharge Discharge Education: Engorgement and breast care;Warning signs for feeding baby;Outpatient recommendation Pump: Personal (Medela pump)  Consult Status Consult Status: PRN Date: 06/23/22  Update provided to care nurse.  Jonna Waunetta Riggle 06/22/2022, 2:41 PM

## 2022-06-23 NOTE — Progress Notes (Signed)
Patient discharged home with infant. FOB present at discharge.  Discharge instructions and prescriptions given and reviewed with patient. Patient verbalized understanding.   Follow-up appointments scheduled.   Escorted out by staff.

## 2022-07-10 ENCOUNTER — Telehealth (INDEPENDENT_AMBULATORY_CARE_PROVIDER_SITE_OTHER): Payer: BC Managed Care – PPO | Admitting: Obstetrics

## 2022-07-10 NOTE — Progress Notes (Signed)
Edinburgh Postnatal Depression Scale - 07/10/22 1535       Edinburgh Postnatal Depression Scale:  In the Past 7 Days   I have been able to laugh and see the funny side of things. 0    I have looked forward with enjoyment to things. 0    I have blamed myself unnecessarily when things went wrong. 0    I have been anxious or worried for no good reason. 0    I have felt scared or panicky for no good reason. 0    Things have been getting on top of me. 0    I have been so unhappy that I have had difficulty sleeping. 0    I have felt sad or miserable. 0    I have been so unhappy that I have been crying. 0    The thought of harming myself has occurred to me. 0    Edinburgh Postnatal Depression Scale Total 0

## 2022-07-10 NOTE — Progress Notes (Signed)
Postpartum Visit  Chief Complaint:  Chief Complaint  Patient presents with   Postpartum Care    Patient reports that she and baby are doing well, patient states that she is breast feeding and baby is latching on well. Patient denies any urinary or bowel concerns and states that she still has some light vaginal bleeding.     History of Present Illness: Patient is a 32 y.o. G2P1001 having a telephone postpartum visit at 2 weeks post delivery I spoke to the patient via phone ( the video call had no sound), and Identified her by nema and DOB. Lurene Shadow and I, Gigi Gin CNM were on the call.  Date of delivery: 06/21/2022 Type of delivery: Vaginal delivery - Vacuum or forceps assisted  no Episiotomy No.  Laceration: no  Pregnancy or labor problems:  no Any problems since the delivery:  no  Newborn Details:  SINGLETON :  1. Baby's name: female. Birth weight: 3060gms Maternal Details:  Breast Feeding:  yes Post partum depression/anxiety noted:  no Edinburgh Post-Partum Depression Score:  0    Past Medical History:  Diagnosis Date   Chlamydia infection affecting pregnancy 07/15/2017   - Treated 4/1 [ ]  needs TOC early May    Past Surgical History:  Procedure Laterality Date   CERVICAL CERCLAGE N/A 07/29/2017   Procedure: CERCLAGE CERVICAL;  Surgeon: Homero Fellers, MD;  Location: [facility]S;  Service: Gynecology;  Laterality: N/A;    Prior to Admission medications   Medication Sig Start Date End Date Taking? Authorizing Provider  Prenatal Vit-Fe Fumarate-FA (MULTIVITAMIN-PRENATAL) 27-0.8 MG TABS tablet Take 1 tablet by mouth daily at 12 noon. 04/23/22  Yes Philip Aspen, CNM    No Known Allergies   Social History   Socioeconomic History   Marital status: Single    Spouse name: Not on file   Number of children: 1   Years of education: 13   Highest education level: Not on file  Occupational History   Occupation: [employer]    Comment: [place]  Tobacco  Use   Smoking status: Former    Types: Cigarettes    Passive exposure: Past (father smoked; years ago)   Smokeless tobacco: Never   Tobacco comments:    Smoked cigarettes occasionally; last use 2017  Vaping Use   Vaping Use: Never used  Substance and Sexual Activity   Alcohol use: Not Currently    Comment: occasional use; Last use one month ago   Drug use: Never   Sexual activity: Yes    Partners: Male    Birth control/protection: None    Comment: hx condoms  Other Topics Concern   Not on file  Social History Narrative   Not on file   Social Determinants of Health   Financial Resource Strain: Low Risk  (01/18/2022)   Overall Financial Resource Strain (CARDIA)    Difficulty of Paying Living Expenses: Not very hard  Food Insecurity: No Food Insecurity (06/21/2022)   Hunger Vital Sign    Worried About Running Out of Food in the Last Year: Never true    Ran Out of Food in the Last Year: Never true  Transportation Needs: No Transportation Needs (06/21/2022)   PRAPARE - Hydrologist (Medical): No    Lack of Transportation (Non-Medical): No  Physical Activity: Insufficiently Active (01/18/2022)   Exercise Vital Sign    Days of Exercise per Week: 2 days    Minutes of Exercise per Session: 40 min  Stress: No Stress Concern Present (01/18/2022)   [place]    Feeling of Stress : Not at all  Social Connections: Unknown (01/18/2022)   Social Connection and Isolation Panel [NHANES]    Frequency of Communication with Friends and Family: More than three times a week    Frequency of Social Gatherings with Friends and Family: Twice a week    Attends Religious Services: More than 4 times per year    Active Member of [employer] or Organizations: No    Attends Archivist Meetings: Never    Marital Status: Not on file  Intimate Partner Violence: Not At Risk (06/21/2022)   Humiliation, Afraid, Rape,  and Kick questionnaire    Fear of Current or Ex-Partner: No    Emotionally Abused: No    Physically Abused: No    Sexually Abused: No    Family History  Problem Relation Age of Onset   Cancer Brother 7       LEUKEMIA   Diabetes Maternal Grandmother    Hypertension Maternal Grandmother    Diabetes Paternal Grandmother     Review of Systems  Constitutional: Negative.   HENT: Negative.    Respiratory: Negative.    Cardiovascular: Negative.   Gastrointestinal: Negative.   Genitourinary: Negative.   Musculoskeletal: Negative.   Skin: Negative.   All other systems reviewed and are negative.    Physical Exam There were no vitals taken for this visit.  OBGyn Exam --no physical- Virtual visit.  Female Chaperone present during breast and/or pelvic exam.  Assessment: 32 y.o. G2P1001 presenting for 2 week postpartum visit + Chlamydia in pregnancy- due for TOC early May.  Plan: Problem List Items Addressed This Visit       Other   Postpartum care following vaginal delivery - Primary     1) Contraception Education given regarding options for contraception, including  undecided. Will discuss at the 6 wk PP visit. Marland Kitchen  She will need a TOC as she was treated for Chlamydia at the end of the pregnancy.  Imagene Riches, CNM  07/10/2022 4:29 PM   07/10/2022 4:21 PM

## 2022-07-23 NOTE — Progress Notes (Unsigned)
   OBSTETRICS POSTPARTUM CLINIC PROGRESS NOTE  Subjective:     Cassie Garza is a 32 y.o. G34P1001 female who presents for a postpartum visit. She is {1-10:13787} {time; units:18646} postpartum following a {delivery:12449}. I have fully reviewed the prenatal and intrapartum course. The delivery was at *** gestational weeks.  Anesthesia: {anesthesia types:812}. Postpartum course has been ***. Baby's course has been ***. Baby is feeding by {breast/bottle:69}. Bleeding: patient {HAS HAS BDZ:32992} not resumed menses, with No LMP recorded.. Bowel function is {normal:32111}. Bladder function is {normal:32111}. Patient {is/is not:9024} sexually active. Contraception method desired is {contraceptive method:5051}. Postpartum depression screening: {neg default:13464::"negative"}.  EDPS score is ***.    The following portions of the patient's history were reviewed and updated as appropriate: allergies, current medications, past family history, past medical history, past social history, past surgical history, and problem list.  Review of Systems {ros; complete:30496}   Objective:    There were no vitals taken for this visit.  General:  alert and no distress   Breasts:  inspection negative, no nipple discharge or bleeding, no masses or nodularity palpable  Lungs: clear to auscultation bilaterally  Heart:  regular rate and rhythm, S1, S2 normal, no murmur, click, rub or gallop  Abdomen: soft, non-tender; bowel sounds normal; no masses,  no organomegaly.  ***Well healed Pfannenstiel incision   Vulva:  normal  Vagina: normal vagina, no discharge, exudate, lesion, or erythema  Cervix:  no cervical motion tenderness and no lesions  Corpus: normal size, contour, position, consistency, mobility, non-tender  Adnexa:  normal adnexa and no mass, fullness, tenderness  Rectal Exam: Not performed.         Labs:  Lab Results  Component Value Date   HGB 10.4 (L) 06/22/2022     Assessment:   No  diagnosis found.   Plan:    1. Contraception: {method:5051} 2. Will check Hgb for h/o postpartum anemia of less than 10.  3. Follow up in: {1-10:13787} {time; units:19136} or as needed.    Loney Laurence, CMA [facility]

## 2022-07-26 ENCOUNTER — Ambulatory Visit (INDEPENDENT_AMBULATORY_CARE_PROVIDER_SITE_OTHER): Payer: BC Managed Care – PPO | Admitting: Obstetrics

## 2022-07-26 ENCOUNTER — Encounter: Payer: Self-pay | Admitting: Obstetrics
